# Patient Record
Sex: Female | Born: 1975 | Race: White | Hispanic: No | Marital: Married | Smoking: Former smoker
Health system: Southern US, Community
[De-identification: ages and names within clinical notes are randomized; demographics above are authoritative.]

## PROBLEM LIST (undated history)

## (undated) DIAGNOSIS — B019 Varicella without complication: Secondary | ICD-10-CM

## (undated) DIAGNOSIS — M419 Scoliosis, unspecified: Secondary | ICD-10-CM

## (undated) DIAGNOSIS — F32A Depression, unspecified: Secondary | ICD-10-CM

## (undated) DIAGNOSIS — F329 Major depressive disorder, single episode, unspecified: Secondary | ICD-10-CM

## (undated) DIAGNOSIS — Z8489 Family history of other specified conditions: Secondary | ICD-10-CM

## (undated) DIAGNOSIS — Z9889 Other specified postprocedural states: Secondary | ICD-10-CM

## (undated) DIAGNOSIS — R112 Nausea with vomiting, unspecified: Secondary | ICD-10-CM

## (undated) HISTORY — PX: TUBAL LIGATION: SHX77

## (undated) HISTORY — PX: OTHER SURGICAL HISTORY: SHX169

## (undated) HISTORY — PX: AUGMENTATION MAMMAPLASTY: SUR837

## (undated) HISTORY — PX: DILATION AND CURETTAGE OF UTERUS: SHX78

## (undated) HISTORY — DX: Major depressive disorder, single episode, unspecified: F32.9

## (undated) HISTORY — PX: EYE SURGERY: SHX253

## (undated) HISTORY — DX: Depression, unspecified: F32.A

## (undated) HISTORY — DX: Varicella without complication: B01.9

---

## 2001-05-19 ENCOUNTER — Other Ambulatory Visit: Admission: RE | Admit: 2001-05-19 | Discharge: 2001-05-19 | Payer: Self-pay | Admitting: Family Medicine

## 2002-08-17 HISTORY — PX: BREAST SURGERY: SHX581

## 2007-05-23 ENCOUNTER — Encounter: Payer: Self-pay | Admitting: Maternal & Fetal Medicine

## 2007-06-13 ENCOUNTER — Encounter: Payer: Self-pay | Admitting: Maternal & Fetal Medicine

## 2007-10-27 ENCOUNTER — Observation Stay: Payer: Self-pay

## 2007-11-02 ENCOUNTER — Inpatient Hospital Stay: Payer: Self-pay | Admitting: Unknown Physician Specialty

## 2008-03-27 ENCOUNTER — Emergency Department: Payer: Self-pay | Admitting: Emergency Medicine

## 2008-03-28 ENCOUNTER — Emergency Department: Payer: Self-pay | Admitting: Emergency Medicine

## 2009-09-03 ENCOUNTER — Ambulatory Visit: Payer: Self-pay

## 2010-08-21 ENCOUNTER — Emergency Department: Payer: Self-pay | Admitting: Emergency Medicine

## 2011-07-17 ENCOUNTER — Emergency Department: Payer: Self-pay | Admitting: Emergency Medicine

## 2011-07-19 ENCOUNTER — Ambulatory Visit: Payer: Self-pay | Admitting: Emergency Medicine

## 2011-07-23 ENCOUNTER — Ambulatory Visit: Payer: Self-pay | Admitting: Obstetrics and Gynecology

## 2011-07-24 ENCOUNTER — Ambulatory Visit: Payer: Self-pay | Admitting: Obstetrics and Gynecology

## 2011-07-28 LAB — PATHOLOGY REPORT

## 2011-09-11 ENCOUNTER — Emergency Department: Payer: Self-pay | Admitting: Internal Medicine

## 2011-11-11 ENCOUNTER — Ambulatory Visit: Payer: Self-pay | Admitting: Obstetrics and Gynecology

## 2011-11-11 DIAGNOSIS — R079 Chest pain, unspecified: Secondary | ICD-10-CM

## 2011-11-11 LAB — COMPREHENSIVE METABOLIC PANEL
Albumin: 4 g/dL (ref 3.4–5.0)
BUN: 9 mg/dL (ref 7–18)
Bilirubin,Total: 0.4 mg/dL (ref 0.2–1.0)
Calcium, Total: 9 mg/dL (ref 8.5–10.1)
Chloride: 107 mmol/L (ref 98–107)
Co2: 29 mmol/L (ref 21–32)
EGFR (African American): >60
EGFR (Non-African Amer.): >60
Osmolality: 283 (ref 275–301)
Potassium: 3.9 mmol/L (ref 3.5–5.1)
SGOT(AST): 22 U/L (ref 15–37)
SGPT (ALT): 20 U/L

## 2011-11-11 LAB — HCG, QUANTITATIVE, PREGNANCY: Beta Hcg, Quant.: <1 m[IU]/mL — ABNORMAL LOW

## 2011-11-11 LAB — CBC
HCT: 40.7 % (ref 35.0–47.0)
MCH: 32 pg (ref 26.0–34.0)
MCV: 94 fL (ref 80–100)
Platelet: 310 10*3/uL (ref 150–440)
RBC: 4.31 10*6/uL (ref 3.80–5.20)
RDW: 13 % (ref 11.5–14.5)

## 2011-11-12 ENCOUNTER — Ambulatory Visit: Payer: Self-pay | Admitting: Obstetrics and Gynecology

## 2011-11-16 LAB — PATHOLOGY REPORT

## 2011-12-15 ENCOUNTER — Ambulatory Visit: Payer: Self-pay | Admitting: Gynecologic Oncology

## 2012-07-25 ENCOUNTER — Emergency Department: Payer: Self-pay | Admitting: Emergency Medicine

## 2012-07-25 LAB — URINALYSIS, COMPLETE
Bacteria: NONE SEEN
Bilirubin,UR: NEGATIVE
Glucose,UR: NEGATIVE mg/dL (ref 0–75)
Leukocyte Esterase: NEGATIVE
Protein: 30
RBC,UR: 2 /HPF (ref 0–5)
Specific Gravity: 1.03 (ref 1.003–1.030)
Squamous Epithelial: 2
WBC UR: 4 /HPF (ref 0–5)

## 2012-09-15 ENCOUNTER — Emergency Department: Payer: Self-pay | Admitting: Emergency Medicine

## 2012-09-15 LAB — URINALYSIS, COMPLETE
Glucose,UR: NEGATIVE mg/dL (ref 0–75)
Ketone: NEGATIVE
Leukocyte Esterase: NEGATIVE
Ph: 8 (ref 4.5–8.0)
Protein: NEGATIVE
Specific Gravity: 1.01 (ref 1.003–1.030)
Squamous Epithelial: <1
WBC UR: <1 /HPF (ref 0–5)

## 2012-09-15 LAB — CBC
HCT: 39.2 % (ref 35.0–47.0)
HGB: 13.5 g/dL (ref 12.0–16.0)
MCH: 32 pg (ref 26.0–34.0)
MCHC: 34.4 g/dL (ref 32.0–36.0)
MCV: 93 fL (ref 80–100)
Platelet: 348 10*3/uL (ref 150–440)
RBC: 4.22 10*6/uL (ref 3.80–5.20)

## 2013-05-08 ENCOUNTER — Inpatient Hospital Stay: Payer: Self-pay

## 2013-05-08 LAB — CBC WITH DIFFERENTIAL/PLATELET
Basophil %: 0.6 %
Eosinophil #: 0.1 10*3/uL (ref 0.0–0.7)
HCT: 39.5 % (ref 35.0–47.0)
HGB: 13.4 g/dL (ref 12.0–16.0)
Lymphocyte #: 2.2 10*3/uL (ref 1.0–3.6)
Lymphocyte %: 19.4 %
MCH: 31.3 pg (ref 26.0–34.0)
MCHC: 33.8 g/dL (ref 32.0–36.0)
MCV: 92 fL (ref 80–100)
Monocyte %: 8 %
Neutrophil #: 8.2 10*3/uL — ABNORMAL HIGH (ref 1.4–6.5)
Platelet: 289 10*3/uL (ref 150–440)
RBC: 4.28 10*6/uL (ref 3.80–5.20)
RDW: 15.4 % — ABNORMAL HIGH (ref 11.5–14.5)

## 2013-05-09 LAB — HEMATOCRIT: HCT: 33.3 % — ABNORMAL LOW (ref 35.0–47.0)

## 2013-05-10 LAB — HEMOGLOBIN: HGB: 10.8 g/dL — ABNORMAL LOW (ref 12.0–16.0)

## 2013-05-10 LAB — PATHOLOGY REPORT

## 2013-11-22 ENCOUNTER — Emergency Department: Payer: Self-pay | Admitting: Emergency Medicine

## 2014-12-07 NOTE — Op Note (Signed)
PATIENT NAME:  Lynn Nelson, CANTIN MR#:  [id number] DATE OF BIRTH:  1976-05-22  DATE OF PROCEDURE:  05/09/2013  PREOPERATIVE DIAGNOSIS: Postpartum day 1, desires permanent sterilization, declines less permanent means.  POSTOPERATIVE DIAGNOSIS: Postpartum day 1, desires permanent sterilization, declines less permanent means.  PROCEDURE: Postpartum bilateral tubal ligation.   SURGEON: Ricky L. Amalia Hailey, M.D.   ANESTHESIA: General endotracheal by Dr. Ronelle Nigh.   FINDINGS: Grossly normal postpartum abdomen, uterine fundus and fallopian tubes.   ESTIMATED BLOOD LOSS: Minimal, less than 5 mL.   COMPLICATIONS: None.   SPECIMENS: Portions of bilateral fallopian tubes.   DRAINS: None.   PROCEDURE IN DETAIL: The patient was consented. I did review with her preoperative less permanent means, option of interval tubal, the risks of bleeding 200 to 300, the risk of failure with 60% chance of subsequent ectopic. They understand and desire to proceed. The consent was signed. She was taken to the operating room and placed in the supine position where anesthesia was initiated, prepped and draped in the usual sterile fashion. The infraumbilical region was injected with 10% of 0.5 Sensorcaine and a semi-lunar transverse incision measuring approximately 3 cm was created with a #15 blade. It was carried sharply down into the peritoneal cavity. With the aid of Omni retractors, I visualized the right fallopian tube. It was grasped, traced to the fimbriated end to ensure identification and regrasped in a relatively avascular portion in the mid ampullary region. A knuckle was created with Kary Kos and double ligated with 0 plain. The knuckle was excised sharply with Metzenbaum. The stumps were cauterized with a Bovie. The area seemed to be hemostatic and allowed to return to the abdominal cavity. We then turned our attention to the left fallopian tube and proceeded in similar fashion. Specimens were sent and labeled as right  fallopian tube and left fallopian tube. Areas were re-inspected visually for hemostasis and seen to be excellent. Fascia was grasped and then closed with a 3 vertical mattress sutures of 0 Vicryl. The subcutaneous was reapproximated with 3-0 Vicryl and a Band-Aid was placed.   The patient tolerated the procedure well. All instrument, needle, and sponge counts were correct. I anticipate a routine postoperative course.   ____________________________ Rockey Situ. Amalia Hailey, MD rle:aw D: 05/09/2013 10:43:55 ET T: 05/09/2013 10:58:46 ET JOB#: [id number]  cc: Audry Pili L. Amalia Hailey, MD, <Dictator> Selmer Dominion MD ELECTRONICALLY SIGNED 05/10/2013 12:40

## 2014-12-09 NOTE — Op Note (Signed)
PATIENT NAME:  Lynn Nelson MR#:  [id number] DATE OF BIRTH:  Apr 06, 1976  DATE OF PROCEDURE:  11/12/2011  PREOPERATIVE DIAGNOSIS: Suspected lesion in the right cornu of the uterus.   POSTOPERATIVE DIAGNOSIS: Suspected lesion in the right cornu of the uterus.   PROCEDURES:  1. Diagnostic laparoscopy. 2. Hysteroscopy. 3. Biopsy of right cornual lesion, on the endometrium, by hysteroscopy.  SURGEON: Prentice Docker, MD  ASSISTANT: Malachy Mood, MD  ANESTHESIA: General.  ESTIMATED BLOOD LOSS: Minimal.  OPERATIVE FLUIDS: 400 mL crystalloid.   COMPLICATIONS: None.   FINDINGS:  1. Two areas of likely endometriosis at site of uterine perforation from prior surgery and along the bladder.  2. No obvious lesion in the right or left cornu by laparoscopy.  3. Small yellow-appearing lesion in the right cornu by hysteroscopy.  SPECIMEN: Biopsy of endometrial lesion from the right cornua.   CONDITION: Stable.  INDICATIONS FOR PROCEDURE: Lynn Nelson is a 39 year old female who had a lesion incidentally noted in her right cornual area, suspected to be in the myometrium but impinging on the endometrium, by ultrasound. This was noted incidentally and has been followed. A MRI was also ordered to better characterize the lesion. The MRI was inconclusive in terms of additional information. Given the patient's considerable worry over the lesion, she was taken to the operating room for diagnostic laparoscopy, diagnostic hysteroscopy, and a biopsy or removal of any abnormal tissue.   PROCEDURE IN DETAIL: The patient was met in the preoperative area and the procedure was reviewed and the patient's questions were answered. She was taken to the operating room and placed under general anesthesia, which was found to be adequate. She was placed in the dorsal supine lithotomy position and prepped and draped in the usual sterile fashion. A sterile speculum was placed in the vagina and a single-tooth  tenaculum was used to grasp the anterior lip of the cervix. The uterus was sounded to approximately 9 cm and a uterine manipulator was affixed to the single-tooth tenaculum. Initially the bladder was straight catheterized and drained of about 300 mL of clear urine.   Attention was turned to the abdomen where after injection of local anesthetic a 5 mm infraumbilical incision was made and the abdomen was entered by using an Optiview trocar under direct visualization. The abdomen was connected to gas and the abdomen opening pressure was found to be consistent with entry into the abdomen. After the abdomen was insufflated with CO2, the camera was introduced through the port and atraumatic entry was verified. A right lower quadrant 5 mm port was placed after injection of local anesthetic under direct intraabdominal visualization and in a similar fashion a left lower quadrant. 5 mm port was placed. The pelvis was inspected with the above noted findings.   Attention was turned to the hysteroscopic portion of the procedure where a speculum was placed and the uterine manipulator was removed. The cervix was dilated gently in a serial fashion using Hegar dilators to a dilatation of 8 mm. The operative hysteroscope was then gently introduced through the cervix with the above noted findings. The resectoscope was used to take a small sample from the right cornual area that appeared to be inconsistent with usual appearing endometrial tissue. There did not seem to be significant distortion of the anatomy of the right cornu from any obvious lesions. This appeared to be similar to the left cornual region. After the biopsy was taken and removed, the hysteroscope was then removed from the uterus and the  acorn manipulator was replaced.   Attention was turned back to the abdomen to ensure no damage was done hysteroscopically. There was no change in the intraabdominal examination and the patient was hemostatic. At this point, it was  felt that the procedure should be terminated as no significant lesion could be found for biopsy removal apart from what had been biopsied hysteroscopically. The instruments were all removed from the abdominal ports. The abdomen was then desufflated of all CO2. The ports were then removed. The skin was reapproximated in the subcutaneous layer using #3-0 Monocryl. The skin was closed with Dermabond at all three incision sites. The acorn manipulator was removed from the cervix followed by removal of the single-tooth tenaculum with hemostasis at the tenaculum entry sites on the cervix. The speculum was then removed. The vagina was verified to be free of any sponges or instruments at the end of the procedure. Of note, the patient was straight catheterized at the beginning of the procedure. However, due to insufficient emptying of her bladder, a Foley catheter had to be placed during the procedure, which was also removed at the end of the procedure by me.   The patient tolerated the procedure well. Sponge, lap, and needle counts were correct x2. For VTE prophylaxis, the patient had pneumatic compression stockings on throughout the entire procedure.  ____________________________ Will Bonnet, MD sdj:slb D: 11/12/2011 12:29:24 ET T: 11/12/2011 13:04:34 ET JOB#: [id number]  cc: Will Bonnet, MD, <Dictator> Will Bonnet MD ELECTRONICALLY SIGNED 11/12/2011 23:42

## 2014-12-25 NOTE — H&P (Signed)
L&D Evaluation:  History:  HPI 39 y/o [id number] edc 9/19   Presents with leaking fluid, 4am   Patient's Medical History No Chronic Illness   Patient's Surgical History none   Medications Pre Natal Vitamins   Allergies NKDA   Social History none   Family History Non-Contributory   ROS:  ROS All systems were reviewed.  HEENT, CNS, GI, GU, Respiratory, CV, Renal and Musculoskeletal systems were found to be normal.   Exam:  General no apparent distress   Abdomen gravid, tender with contractions   Estimated Fetal Weight Average for gestational age   Pelvic 4/90   Mebranes Ruptured   Description clear   FHT normal rate with no decels   Ucx regular   Impression:  Impression early labor   Plan:  Plan monitor contractions and for cervical change   Comments epidural prn   Electronic Signatures: Edison Nasuti (MD)  (Signed 22-Sep-14 06:35)  Authored: L&D Evaluation   Last Updated: 22-Sep-14 06:35 by Edison Nasuti (MD)

## 2015-01-16 ENCOUNTER — Emergency Department
Admission: EM | Admit: 2015-01-16 | Discharge: 2015-01-16 | Disposition: A | Discharge status: Home / Self Care | Payer: PRIVATE HEALTH INSURANCE | Attending: Emergency Medicine | Admitting: Emergency Medicine

## 2015-01-16 DIAGNOSIS — Y998 Other external cause status: Secondary | ICD-10-CM | POA: Diagnosis not present

## 2015-01-16 DIAGNOSIS — Y9289 Other specified places as the place of occurrence of the external cause: Secondary | ICD-10-CM | POA: Diagnosis not present

## 2015-01-16 DIAGNOSIS — S70362A Insect bite (nonvenomous), left thigh, initial encounter: Secondary | ICD-10-CM | POA: Diagnosis present

## 2015-01-16 DIAGNOSIS — Y9389 Activity, other specified: Secondary | ICD-10-CM | POA: Insufficient documentation

## 2015-01-16 DIAGNOSIS — Z880 Allergy status to penicillin: Secondary | ICD-10-CM | POA: Insufficient documentation

## 2015-01-16 DIAGNOSIS — W57XXXA Bitten or stung by nonvenomous insect and other nonvenomous arthropods, initial encounter: Secondary | ICD-10-CM | POA: Insufficient documentation

## 2015-01-16 DIAGNOSIS — L03116 Cellulitis of left lower limb: Secondary | ICD-10-CM | POA: Diagnosis not present

## 2015-01-16 MED ORDER — DOXYCYCLINE HYCLATE 100 MG PO CAPS: 100.0000 mg | ORAL_CAPSULE | Freq: Two times a day (BID) | ORAL | Status: DC

## 2015-01-16 NOTE — ED Provider Notes (Signed)
[facility] Emergency Department Provider Note ____________________________________________  Time seen: 1205  I have reviewed the triage vital signs and the nursing notes.   HISTORY  Chief Complaint Insect Bite   HPI Lynn Nelson is a 39 y.o. female patient is here today with complaint of left inner thigh redness after being bit by a tick. She states this happened Sunday and the swelling began the next day. She denies any known fever. She states she has had a headache since yesterday and feels extremely tired. She has not taken any medication for this and currently her pain is 5 out of 10.   History reviewed. No pertinent past medical history.  There are no active problems to display for this patient.   Past Surgical History  Procedure Laterality Date  . Tubal ligation    . Dilation and curettage of uterus      x2  . Breast agumentation       BL    Current Outpatient Rx  Name  Route  Sig  Dispense  Refill  . doxycycline (VIBRAMYCIN) 100 MG capsule   Oral   Take 1 capsule (100 mg total) by mouth 2 (two) times daily.   14 capsule   0     Allergies Penicillins  No family history on file.  Social History History  Substance Use Topics  . Smoking status: Never Smoker   . Smokeless tobacco: Never Used  . Alcohol Use: 0.6 oz/week    1 Glasses of wine per week    Review of Systems Constitutional: No fever/chills Eyes: No visual changes. ENT: No sore throat. Cardiovascular: Denies chest pain. Respiratory: Denies shortness of breath. Gastrointestinal: No abdominal pain.  No nausea, no vomiting .Genitourinary: Negative for dysuria. Musculoskeletal: Negative for back pain. Skin: Negative for rash. Neurological: Negative for headaches, focal weakness or numbness.  10-point ROS otherwise negative.  ____________________________________________   PHYSICAL EXAM:  VITAL SIGNS: ED Triage Vitals  Enc Vitals Group     BP 01/16/15 1057  109/63 mmHg     Pulse Rate 01/16/15 1057 61     Resp 01/16/15 1057 15     Temp 01/16/15 1057 97.6 F (36.4 C)     Temp Source 01/16/15 1057 Oral     SpO2 01/16/15 1057 100 %     Weight 01/16/15 1057 137 lb (62.143 kg)     Height 01/16/15 1057 5\' 4"  (1.626 m)     Head Cir --      Peak Flow --      Pain Score 01/16/15 1135 5     Pain Loc --      Pain Edu? --      Excl. in [place]? --     Constitutional: Alert and oriented. Well appearing and in no acute distress. Eyes: Conjunctivae are normal. PERRL. EOMI. Head: Atraumatic. Nose: No congestion/rhinnorhea. Neck: No stridor.   Cardiovascular: Normal rate, regular rhythm. Grossly normal heart sounds.  Good peripheral circulation. Respiratory: Normal respiratory effort.  No retractions. Lungs CTAB. Gastrointestinal: Soft and nontender. No distention. No abdominal bruits. No CVA tenderness. Musculoskeletal: No lower extremity tenderness nor edema on the right. Left leg minimally restricted secondary to pain. No joint effusions.  Neurologic:  Normal speech and language. No gross focal neurologic deficits are appreciated. Speech is normal. No gait instability. Skin:  Skin is warm, dry. Left anterior and medial thigh is red and warm. There is one area that is consistent with a tick bite Psychiatric: Mood and  affect are normal. Speech and behavior are normal.  ____________________________________________   LABS (all labs ordered are listed, but only abnormal results are displayed)  Labs Reviewed - No data to display ____________________________________________  PROCEDURES  Procedure(s) performed: None  Critical Care performed: No  ____________________________________________   INITIAL IMPRESSION / ASSESSMENT AND PLAN / ED COURSE  Pertinent labs & imaging results that were available during my care of the patient were reviewed by me and considered in my medical decision making (see chart for details).  Patient was started on  doxycycline. She is return to the emergency room if any severe worsening, fever, unable take the doxycycline. ____________________________________________   FINAL CLINICAL IMPRESSION(S) / ED DIAGNOSES  Final diagnoses:  Cellulitis of left thigh  Tick bite of thigh, left, initial encounter      Johnn Hai, PA-C 01/16/15 1421

## 2015-01-16 NOTE — Discharge Instructions (Signed)
° °  You will need to return to ER if any severe worsening of your symptoms. YOU CAN NOT GO OUT INTO THE SUN WITH THIS MEDICATION

## 2015-01-16 NOTE — ED Notes (Signed)
Pt states she removed a tick from her left upper leg a couple of days ago and is c/o rash near the area

## 2015-01-16 NOTE — ED Notes (Signed)
Pt states that she got a tick bite on her left inner thigh . She states that she pulled it off and is sure that it was a tick. She states that this happened this Sunday. The redness and swelling in the area has been increasing since then . And she states that she has pain in the area. She states that she has been extremely tired and and has had headache since yesterday.

## 2015-10-10 ENCOUNTER — Encounter: Payer: Self-pay | Admitting: Nurse Practitioner

## 2015-10-10 ENCOUNTER — Ambulatory Visit (INDEPENDENT_AMBULATORY_CARE_PROVIDER_SITE_OTHER): Payer: Managed Care, Other (non HMO) | Admitting: Nurse Practitioner

## 2015-10-10 VITALS — BP 90/62 | HR 89 | Temp 98.2°F | Resp 18 | Ht 64.0 in | Wt 124.1 lb

## 2015-10-10 DIAGNOSIS — F4329 Adjustment disorder with other symptoms: Secondary | ICD-10-CM

## 2015-10-10 DIAGNOSIS — Z634 Disappearance and death of family member: Secondary | ICD-10-CM

## 2015-10-10 DIAGNOSIS — Z7189 Other specified counseling: Secondary | ICD-10-CM | POA: Diagnosis not present

## 2015-10-10 DIAGNOSIS — Z7689 Persons encountering health services in other specified circumstances: Secondary | ICD-10-CM

## 2015-10-10 DIAGNOSIS — F4321 Adjustment disorder with depressed mood: Secondary | ICD-10-CM

## 2015-10-10 MED ORDER — BUSPIRONE HCL 5 MG PO TABS: 5.0000 mg | ORAL_TABLET | Freq: Three times a day (TID) | ORAL | Status: DC

## 2015-10-10 NOTE — Patient Instructions (Addendum)
Follow up with me in 3-4 weeks.   Welcome to [facility]! Nice to meet you.

## 2015-10-10 NOTE — Progress Notes (Signed)
Patient ID: Lynn Nelson, female    DOB: April 15, 1976  Age: 40 y.o. MRN: [id number]  CC: Establish Care   HPI Lynn Nelson presents for establishing care and CC of bereavement.   1) Pt reports she lost her husband 2 weeks ago in an accident. They were separated at the time and she has two young sons (2 and 13 yo). The 54 yo has been given resources for counseling by school guidance counselors. She is interested in going through Hospice for bereavement counseling for her and her son. She has the number. She is flat, but breaks into tears when speaking about him.   History Lynn Nelson has a past medical history of Depression and Chicken pox.   She has past surgical history that includes Tubal ligation; Dilation and curettage of uterus; breast agumentation ; Eye surgery; and Breast surgery (2004).   Her family history includes Alcohol abuse in her paternal grandfather and paternal grandmother; Cancer in her mother, paternal grandfather, and paternal grandmother; Diabetes in her maternal grandmother; Heart disease in her father; Stroke in her maternal grandmother.She reports that she has never smoked. She has never used smokeless tobacco. She reports that she drinks about 1.8 oz of alcohol per week. She reports that she does not use illicit drugs.  Outpatient Prescriptions Prior to Visit  Medication Sig Dispense Refill  . doxycycline (VIBRAMYCIN) 100 MG capsule Take 1 capsule (100 mg total) by mouth 2 (two) times daily. (Patient not taking: Reported on 10/10/2015) 14 capsule 0   No facility-administered medications prior to visit.    ROS Review of Systems  Constitutional: Negative for fever, chills, diaphoresis and fatigue.  Respiratory: Negative for chest tightness, shortness of breath and wheezing.   Cardiovascular: Negative for chest pain, palpitations and leg swelling.  Gastrointestinal: Negative for nausea, vomiting and diarrhea.  Skin: Negative for rash.  Neurological: Negative for  dizziness and headaches.  Psychiatric/Behavioral: Positive for sleep disturbance. Negative for suicidal ideas. The patient is nervous/anxious.     Objective:  BP 90/62 mmHg  Pulse 89  Temp(Src) 98.2 F (36.8 C) (Oral)  Resp 18  Ht 5\' 4"  (1.626 m)  Wt 124 lb 2 oz (56.303 kg)  BMI 21.30 kg/m2  SpO2 98%  LMP 09/19/2015 (Exact Date)  Physical Exam  Constitutional: She is oriented to person, place, and time. She appears well-developed and well-nourished. No distress.  HENT:  Head: Normocephalic and atraumatic.  Right Ear: External ear normal.  Left Ear: External ear normal.  Cardiovascular: Normal rate, regular rhythm and normal heart sounds.  Exam reveals no gallop and no friction rub.   No murmur heard. Pulmonary/Chest: Effort normal and breath sounds normal. No respiratory distress. She has no wheezes. She has no rales. She exhibits no tenderness.  Neurological: She is alert and oriented to person, place, and time. No cranial nerve deficit. She exhibits normal muscle tone. Coordination normal.  Skin: Skin is warm and dry. No rash noted. She is not diaphoretic.  Psychiatric: Her behavior is normal. Judgment and thought content normal.  Patient is tearful in talking about husband, but seems flat otherwise      Assessment & Plan:   Lynn Nelson was seen today for establish care.  Diagnoses and all orders for this visit:  Encounter to establish care  '  Other orders -     busPIRone (BUSPAR) 5 MG tablet; Take 1 tablet (5 mg total) by mouth 3 (three) times daily.   I have discontinued Ms. Rakes's doxycycline. I am  also having her start on busPIRone.  Meds ordered this encounter  Medications  . busPIRone (BUSPAR) 5 MG tablet    Sig: Take 1 tablet (5 mg total) by mouth 3 (three) times daily.    Dispense:  90 tablet    Refill:  0    Order Specific Question:  Supervising Provider    Answer:  Crecencio Mc [[id number]]     Follow-up: Return in about 4 weeks (around 11/07/2015)  for FU Medication.

## 2015-10-10 NOTE — Progress Notes (Signed)
Pre visit review using our clinic review tool, if applicable. No additional management support is needed unless otherwise documented below in the visit note. 

## 2015-10-17 DIAGNOSIS — Z7689 Persons encountering health services in other specified circumstances: Secondary | ICD-10-CM | POA: Insufficient documentation

## 2015-10-17 DIAGNOSIS — F4329 Adjustment disorder with other symptoms: Secondary | ICD-10-CM | POA: Insufficient documentation

## 2015-10-17 DIAGNOSIS — Z634 Disappearance and death of family member: Secondary | ICD-10-CM

## 2015-10-17 DIAGNOSIS — F4321 Adjustment disorder with depressed mood: Secondary | ICD-10-CM | POA: Insufficient documentation

## 2015-10-17 NOTE — Assessment & Plan Note (Signed)
Discussed acute and chronic issues. Reviewed health maintenance measures, PFSHx, and immunizations. Obtain records from previous places

## 2015-10-17 NOTE — Assessment & Plan Note (Signed)
Patient is currently having complicated grieving this is new onset due to recent passing of husband She has 2 small children is supported by her mother currently Patient is willing to try medications but is also interested in hospice counseling, she has been number and will contact them for further information We will follow-up closely in 4 weeks or sooner as needed We will try BuSpar 5 mg up to 3 times daily as needed discussed that if it's, risks, side effects and asked her to call if anything she is concerned about

## 2015-11-05 ENCOUNTER — Other Ambulatory Visit: Payer: Self-pay | Admitting: Nurse Practitioner

## 2015-11-07 ENCOUNTER — Ambulatory Visit (INDEPENDENT_AMBULATORY_CARE_PROVIDER_SITE_OTHER): Payer: Managed Care, Other (non HMO) | Admitting: Nurse Practitioner

## 2015-11-07 ENCOUNTER — Encounter: Payer: Self-pay | Admitting: Nurse Practitioner

## 2015-11-07 VITALS — BP 104/68 | HR 60 | Temp 98.1°F | Resp 14 | Ht 64.0 in | Wt 124.8 lb

## 2015-11-07 DIAGNOSIS — F4329 Adjustment disorder with other symptoms: Secondary | ICD-10-CM

## 2015-11-07 DIAGNOSIS — F4321 Adjustment disorder with depressed mood: Secondary | ICD-10-CM

## 2015-11-07 DIAGNOSIS — Z634 Disappearance and death of family member: Principal | ICD-10-CM

## 2015-11-07 MED ORDER — BUSPIRONE HCL 10 MG PO TABS: 10.0000 mg | ORAL_TABLET | Freq: Three times a day (TID) | ORAL | Status: DC

## 2015-11-07 NOTE — Progress Notes (Signed)
Patient ID: Lynn Nelson, female    DOB: Apr 05, 1976  Age: 40 y.o. MRN: [id number]  CC: Depression   HPI Lynn Nelson presents for follow up bereavement and meds.   1) Started back to work and it has been a distraction  Getting sons into counseling and then herself at a later date Taking BuSpar three times daily and somewhat helpful   Wearing off after 2 hrs  Sleeping has improved somewhat  Finding ways to celebrate her late husband's life in nature, tatoos, and artifacts left behind.   History Lynn Nelson has a past medical history of Depression and Chicken pox.   She has past surgical history that includes Tubal ligation; Dilation and curettage of uterus; breast agumentation ; Eye surgery; and Breast surgery (2004).   Her family history includes Alcohol abuse in her paternal grandfather and paternal grandmother; Cancer in her mother, paternal grandfather, and paternal grandmother; Diabetes in her maternal grandmother; Heart disease in her father; Stroke in her maternal grandmother.She reports that she has never smoked. She has never used smokeless tobacco. She reports that she drinks about 1.8 oz of alcohol per week. She reports that she does not use illicit drugs.  Outpatient Prescriptions Prior to Visit  Medication Sig Dispense Refill  . busPIRone (BUSPAR) 5 MG tablet Take 1 tablet (5 mg total) by mouth 3 (three) times daily. 90 tablet 0   No facility-administered medications prior to visit.    ROS Review of Systems  Constitutional: Negative for fever, chills, diaphoresis and fatigue.  Respiratory: Negative for chest tightness, shortness of breath and wheezing.   Cardiovascular: Negative for chest pain, palpitations and leg swelling.  Gastrointestinal: Negative for nausea, vomiting and diarrhea.  Skin: Negative for rash.  Neurological: Negative for dizziness and headaches.  Psychiatric/Behavioral: Negative for suicidal ideas, sleep disturbance and self-injury. The patient  is nervous/anxious.     Objective:  BP 104/68 mmHg  Pulse 60  Temp(Src) 98.1 F (36.7 C) (Oral)  Resp 14  Ht 5\' 4"  (1.626 m)  Wt 124 lb 12.8 oz (56.609 kg)  BMI 21.41 kg/m2  SpO2 99%  LMP 09/19/2015 (Exact Date)  Physical Exam  Constitutional: She is oriented to person, place, and time. She appears well-developed and well-nourished. No distress.  HENT:  Head: Normocephalic and atraumatic.  Right Ear: External ear normal.  Left Ear: External ear normal.  Cardiovascular: Normal rate and regular rhythm.   Pulmonary/Chest: Effort normal and breath sounds normal. No respiratory distress. She has no wheezes. She has no rales. She exhibits no tenderness.  Neurological: She is alert and oriented to person, place, and time.  Skin: Skin is warm and dry. No rash noted. She is not diaphoretic.  Psychiatric: She has a normal mood and affect. Her behavior is normal. Judgment and thought content normal.  Good eye contact, not tearful   Assessment & Plan:   [name] was seen today for depression.  Diagnoses and all orders for this visit:  Complicated grieving  Other orders -     busPIRone (BUSPAR) 10 MG tablet; Take 1 tablet (10 mg total) by mouth 3 (three) times daily.  I have discontinued Ms. Rakes's busPIRone. I am also having her start on busPIRone.  Meds ordered this encounter  Medications  . busPIRone (BUSPAR) 10 MG tablet    Sig: Take 1 tablet (10 mg total) by mouth 3 (three) times daily.    Dispense:  90 tablet    Refill:  0    Order Specific Question:  Supervising Provider    Answer:  Crecencio Mc [[id number]]     Follow-up: Return in about 4 weeks (around 12/05/2015) for Follow up.

## 2015-11-07 NOTE — Patient Instructions (Signed)
Great to see you!  I will see you in 4 weeks to check in.

## 2015-11-08 NOTE — Assessment & Plan Note (Signed)
Seems stable today Pt seems to have a good grip on what has happened, dealing with family very well and helping sons deal with grief first.  Will up buspirone to 10 mg slowly titrate back up to 3 x daily and see if this is better.  FU in 4 weeks or sooner

## 2015-11-14 ENCOUNTER — Telehealth: Payer: Self-pay

## 2015-11-14 ENCOUNTER — Encounter: Payer: Self-pay | Admitting: Nurse Practitioner

## 2015-11-14 ENCOUNTER — Ambulatory Visit (INDEPENDENT_AMBULATORY_CARE_PROVIDER_SITE_OTHER): Payer: Managed Care, Other (non HMO) | Admitting: Nurse Practitioner

## 2015-11-14 VITALS — BP 100/60 | HR 60 | Temp 98.0°F | Ht 64.0 in | Wt 124.3 lb

## 2015-11-14 DIAGNOSIS — Z634 Disappearance and death of family member: Secondary | ICD-10-CM

## 2015-11-14 DIAGNOSIS — Z1239 Encounter for other screening for malignant neoplasm of breast: Secondary | ICD-10-CM | POA: Diagnosis not present

## 2015-11-14 DIAGNOSIS — F4329 Adjustment disorder with other symptoms: Secondary | ICD-10-CM

## 2015-11-14 DIAGNOSIS — Z0000 Encounter for general adult medical examination without abnormal findings: Secondary | ICD-10-CM | POA: Diagnosis not present

## 2015-11-14 DIAGNOSIS — F4321 Adjustment disorder with depressed mood: Secondary | ICD-10-CM | POA: Diagnosis not present

## 2015-11-14 LAB — CBC WITH DIFFERENTIAL/PLATELET
BASOS PCT: 0.4 % (ref 0.0–3.0)
Basophils Absolute: 0 10*3/uL (ref 0.0–0.1)
EOS ABS: 0.1 10*3/uL (ref 0.0–0.7)
Eosinophils Relative: 1 % (ref 0.0–5.0)
HEMATOCRIT: 40.8 % (ref 36.0–46.0)
Hemoglobin: 13.9 g/dL (ref 12.0–15.0)
LYMPHS ABS: 1.5 10*3/uL (ref 0.7–4.0)
Lymphocytes Relative: 22.5 % (ref 12.0–46.0)
MCHC: 34.2 g/dL (ref 30.0–36.0)
MCV: 92.1 fl (ref 78.0–100.0)
MONO ABS: 0.5 10*3/uL (ref 0.1–1.0)
Monocytes Relative: 8.2 % (ref 3.0–12.0)
NEUTROS ABS: 4.5 10*3/uL (ref 1.4–7.7)
Neutrophils Relative %: 67.9 % (ref 43.0–77.0)
PLATELETS: 407 10*3/uL — AB (ref 150.0–400.0)
RBC: 4.43 Mil/uL (ref 3.87–5.11)
RDW: 13.4 % (ref 11.5–15.5)
WBC: 6.7 10*3/uL (ref 4.0–10.5)

## 2015-11-14 LAB — LIPID PANEL
CHOL/HDL RATIO: 3
Cholesterol: 183 mg/dL (ref 0–200)
HDL: 72.2 mg/dL (ref >39.00)
LDL CALC: 93 mg/dL (ref 0–99)
NonHDL: 110.58
TRIGLYCERIDES: 86 mg/dL (ref 0.0–149.0)
VLDL: 17.2 mg/dL (ref 0.0–40.0)

## 2015-11-14 LAB — TSH: TSH: 1.51 u[IU]/mL (ref 0.35–4.50)

## 2015-11-14 LAB — HEMOGLOBIN A1C: HEMOGLOBIN A1C: 5.2 % (ref 4.6–6.5)

## 2015-11-14 LAB — COMPREHENSIVE METABOLIC PANEL
ALT: 16 U/L (ref 0–35)
AST: 15 U/L (ref 0–37)
Albumin: 4.5 g/dL (ref 3.5–5.2)
Alkaline Phosphatase: 52 U/L (ref 39–117)
BUN: 12 mg/dL (ref 6–23)
CALCIUM: 9.7 mg/dL (ref 8.4–10.5)
CHLORIDE: 102 meq/L (ref 96–112)
CO2: 28 meq/L (ref 19–32)
Creatinine, Ser: 0.74 mg/dL (ref 0.40–1.20)
GFR: 93.39 mL/min (ref >60.00)
Glucose, Bld: 76 mg/dL (ref 70–99)
Potassium: 4.9 mEq/L (ref 3.5–5.1)
Sodium: 138 mEq/L (ref 135–145)
Total Bilirubin: 0.6 mg/dL (ref 0.2–1.2)
Total Protein: 7 g/dL (ref 6.0–8.3)

## 2015-11-14 NOTE — Telephone Encounter (Signed)
Pt had an appt today for CPE. She brought with her an employer routine Preventive Care form.  Form filled out and stamped with office stamp.

## 2015-11-14 NOTE — Patient Instructions (Signed)
[facility] at [facility]  Address: [address] [place], [place] [address]  Phone: [phone] Hours:  Monday - Thursday: 8 a.m. - 5 p.m. Friday: 8 a.m. - 3 p.m.     Health Maintenance, Female Adopting a healthy lifestyle and getting preventive care can go a long way to promote health and wellness. Talk with your health care provider about what schedule of regular examinations is right for you. This is a good chance for you to check in with your provider about disease prevention and staying healthy. In between checkups, there are plenty of things you can do on your own. Experts have done a lot of research about which lifestyle changes and preventive measures are most likely to keep you healthy. Ask your health care provider for more information. WEIGHT AND DIET  Eat a healthy diet  Be sure to include plenty of vegetables, fruits, low-fat dairy products, and lean protein.  Do not eat a lot of foods high in solid fats, added sugars, or salt.  Get regular exercise. This is one of the most important things you can do for your health.  Most adults should exercise for at least 150 minutes each week. The exercise should increase your heart rate and make you sweat (moderate-intensity exercise).  Most adults should also do strengthening exercises at least twice a week. This is in addition to the moderate-intensity exercise.  Maintain a healthy weight  Body mass index (BMI) is a measurement that can be used to identify possible weight problems. It estimates body fat based on height and weight. Your health care provider can help determine your BMI and help you achieve or maintain a healthy weight.  For females 80 years of age and older:   A BMI below 18.5 is considered underweight.  A BMI of 18.5 to 24.9 is normal.  A BMI of 25 to 29.9 is considered overweight.  A BMI of 30 and above is considered obese.  Watch levels of cholesterol and blood lipids  You  should start having your blood tested for lipids and cholesterol at 38 years of age, then have this test every 5 years.  You may need to have your cholesterol levels checked more often if:  Your lipid or cholesterol levels are high.  You are older than 38 years of age.  You are at high risk for heart disease.  CANCER SCREENING   Lung Cancer  Lung cancer screening is recommended for adults 35-6 years old who are at high risk for lung cancer because of a history of smoking.  A yearly low-dose CT scan of the lungs is recommended for people who:  Currently smoke.  Have quit within the past 15 years.  Have at least a 30-pack-year history of smoking. A pack year is smoking an average of one pack of cigarettes a day for 1 year.  Yearly screening should continue until it has been 15 years since you quit.  Yearly screening should stop if you develop a health problem that would prevent you from having lung cancer treatment.  Breast Cancer  Practice breast self-awareness. This means understanding how your breasts normally appear and feel.  It also means doing regular breast self-exams. Let your health care provider know about any changes, no matter how small.  If you are in your 20s or 30s, you should have a clinical breast exam (CBE) by a health care provider every 1-3 years as part of a regular health exam.  If you are  71 or older, have a CBE every year. Also consider having a breast X-ray (mammogram) every year.  If you have a family history of breast cancer, talk to your health care provider about genetic screening.  If you are at high risk for breast cancer, talk to your health care provider about having an MRI and a mammogram every year.  Breast cancer gene (BRCA) assessment is recommended for women who have family members with BRCA-related cancers. BRCA-related cancers include:  Breast.  Ovarian.  Tubal.  Peritoneal cancers.  Results of the assessment will determine  the need for genetic counseling and BRCA1 and BRCA2 testing. Cervical Cancer Your health care provider may recommend that you be screened regularly for cancer of the pelvic organs (ovaries, uterus, and vagina). This screening involves a pelvic examination, including checking for microscopic changes to the surface of your cervix (Pap test). You may be encouraged to have this screening done every 3 years, beginning at age 42.  For women ages 28-65, health care providers may recommend pelvic exams and Pap testing every 3 years, or they may recommend the Pap and pelvic exam, combined with testing for human papilloma virus (HPV), every 5 years. Some types of HPV increase your risk of cervical cancer. Testing for HPV may also be done on women of any age with unclear Pap test results.  Other health care providers may not recommend any screening for nonpregnant women who are considered low risk for pelvic cancer and who do not have symptoms. Ask your health care provider if a screening pelvic exam is right for you.  If you have had past treatment for cervical cancer or a condition that could lead to cancer, you need Pap tests and screening for cancer for at least 20 years after your treatment. If Pap tests have been discontinued, your risk factors (such as having a new sexual partner) need to be reassessed to determine if screening should resume. Some women have medical problems that increase the chance of getting cervical cancer. In these cases, your health care provider may recommend more frequent screening and Pap tests. Colorectal Cancer  This type of cancer can be detected and often prevented.  Routine colorectal cancer screening usually begins at 38 years of age and continues through 38 years of age.  Your health care provider may recommend screening at an earlier age if you have risk factors for colon cancer.  Your health care provider may also recommend using home test kits to check for hidden blood  in the stool.  A small camera at the end of a tube can be used to examine your colon directly (sigmoidoscopy or colonoscopy). This is done to check for the earliest forms of colorectal cancer.  Routine screening usually begins at age 61.  Direct examination of the colon should be repeated every 5-10 years through 38 years of age. However, you may need to be screened more often if early forms of precancerous polyps or small growths are found. Skin Cancer  Check your skin from head to toe regularly.  Tell your health care provider about any new moles or changes in moles, especially if there is a change in a mole's shape or color.  Also tell your health care provider if you have a mole that is larger than the size of a pencil eraser.  Always use sunscreen. Apply sunscreen liberally and repeatedly throughout the day.  Protect yourself by wearing long sleeves, pants, a wide-brimmed hat, and sunglasses whenever you are outside. HEART  DISEASE, DIABETES, AND HIGH BLOOD PRESSURE   High blood pressure causes heart disease and increases the risk of stroke. High blood pressure is more likely to develop in:  People who have blood pressure in the high end of the normal range (130-139/85-89 mm Hg).  People who are overweight or obese.  People who are African American.  If you are 69-71 years of age, have your blood pressure checked every 3-5 years. If you are 47 years of age or older, have your blood pressure checked every year. You should have your blood pressure measured twice--once when you are at a hospital or clinic, and once when you are not at a hospital or clinic. Record the average of the two measurements. To check your blood pressure when you are not at a hospital or clinic, you can use:  An automated blood pressure machine at a pharmacy.  A home blood pressure monitor.  If you are between 25 years and 92 years old, ask your health care provider if you should take aspirin to prevent  strokes.  Have regular diabetes screenings. This involves taking a blood sample to check your fasting blood sugar level.  If you are at a normal weight and have a low risk for diabetes, have this test once every three years after 38 years of age.  If you are overweight and have a high risk for diabetes, consider being tested at a younger age or more often. PREVENTING INFECTION  Hepatitis B  If you have a higher risk for hepatitis B, you should be screened for this virus. You are considered at high risk for hepatitis B if:  You were born in a country where hepatitis B is common. Ask your health care provider which countries are considered high risk.  Your parents were born in a high-risk country, and you have not been immunized against hepatitis B (hepatitis B vaccine).  You have HIV or AIDS.  You use needles to inject street drugs.  You live with someone who has hepatitis B.  You have had sex with someone who has hepatitis B.  You get hemodialysis treatment.  You take certain medicines for conditions, including cancer, organ transplantation, and autoimmune conditions. Hepatitis C  Blood testing is recommended for:  Everyone born from [birth year] through 1963.  Anyone with known risk factors for hepatitis C. Sexually transmitted infections (STIs)  You should be screened for sexually transmitted infections (STIs) including gonorrhea and chlamydia if:  You are sexually active and are younger than 38 years of age.  You are older than 38 years of age and your health care provider tells you that you are at risk for this type of infection.  Your sexual activity has changed since you were last screened and you are at an increased risk for chlamydia or gonorrhea. Ask your health care provider if you are at risk.  If you do not have HIV, but are at risk, it may be recommended that you take a prescription medicine daily to prevent HIV infection. This is called pre-exposure prophylaxis  (PrEP). You are considered at risk if:  You are sexually active and do not regularly use condoms or know the HIV status of your partner(s).  You take drugs by injection.  You are sexually active with a partner who has HIV. Talk with your health care provider about whether you are at high risk of being infected with HIV. If you choose to begin PrEP, you should first be tested for HIV. You should  then be tested every 3 months for as long as you are taking PrEP.  PREGNANCY   If you are premenopausal and you may become pregnant, ask your health care provider about preconception counseling.  If you may become pregnant, take 400 to 800 micrograms (mcg) of folic acid every day.  If you want to prevent pregnancy, talk to your health care provider about birth control (contraception). OSTEOPOROSIS AND MENOPAUSE   Osteoporosis is a disease in which the bones lose minerals and strength with aging. This can result in serious bone fractures. Your risk for osteoporosis can be identified using a bone density scan.  If you are 71 years of age or older, or if you are at risk for osteoporosis and fractures, ask your health care provider if you should be screened.  Ask your health care provider whether you should take a calcium or vitamin D supplement to lower your risk for osteoporosis.  Menopause may have certain physical symptoms and risks.  Hormone replacement therapy may reduce some of these symptoms and risks. Talk to your health care provider about whether hormone replacement therapy is right for you.  HOME CARE INSTRUCTIONS   Schedule regular health, dental, and eye exams.  Stay current with your immunizations.   Do not use any tobacco products including cigarettes, chewing tobacco, or electronic cigarettes.  If you are pregnant, do not drink alcohol.  If you are breastfeeding, limit how much and how often you drink alcohol.  Limit alcohol intake to no more than 1 drink per day for  nonpregnant women. One drink equals 12 ounces of beer, 5 ounces of wine, or 1 ounces of hard liquor.  Do not use street drugs.  Do not share needles.  Ask your health care provider for help if you need support or information about quitting drugs.  Tell your health care provider if you often feel depressed.  Tell your health care provider if you have ever been abused or do not feel safe at home.   This information is not intended to replace advice given to you by your health care provider. Make sure you discuss any questions you have with your health care provider.   Document Released: 02/16/2011 Document Revised: 08/24/2014 Document Reviewed: 07/05/2013 Elsevier Interactive Patient Education [employer].

## 2015-11-14 NOTE — Progress Notes (Signed)
Patient ID: Lynn Nelson, female    DOB: 1976/06/08  Age: 40 y.o. MRN: [id number]  CC: Annual Exam   HPI Lynn Nelson presents for Annual exam.   1) Annual Physical   Diet- Healthy diet   Exercise- active   Immunizations- UTD  PAP- [facility] over 3 yrs ago  Mammogram- [facility], mom had breast cancer   Eye Exam- Not UTD  Dental Exam- UTD  LMP- Currently menstruating  Labs- Today   Depression- Active tx for depression  Refills: Breaking down Buspirone 5 mg every 3 hours    History Lynn Nelson has a past medical history of Depression and Chicken pox.   She has past surgical history that includes Tubal ligation; Dilation and curettage of uterus; breast agumentation ; Eye surgery; and Breast surgery (2004).   Her family history includes Alcohol abuse in her paternal grandfather and paternal grandmother; Cancer in her mother, paternal grandfather, and paternal grandmother; Diabetes in her maternal grandmother; Heart disease in her father; Stroke in her maternal grandmother.She reports that she has never smoked. She has never used smokeless tobacco. She reports that she drinks about 1.8 oz of alcohol per week. She reports that she does not use illicit drugs.  Outpatient Prescriptions Prior to Visit  Medication Sig Dispense Refill  . busPIRone (BUSPAR) 10 MG tablet Take 1 tablet (10 mg total) by mouth 3 (three) times daily. 90 tablet 0   No facility-administered medications prior to visit.    ROS Review of Systems  Constitutional: Negative for fever, chills, diaphoresis, fatigue and unexpected weight change.  HENT: Negative for tinnitus and trouble swallowing.   Eyes: Negative for visual disturbance.  Respiratory: Negative for chest tightness, shortness of breath and wheezing.   Cardiovascular: Negative for chest pain, palpitations and leg swelling.  Gastrointestinal: Negative for nausea, vomiting, abdominal pain, diarrhea, constipation and blood in stool.   Endocrine: Negative for polydipsia, polyphagia and polyuria.  Genitourinary: Negative for dysuria, hematuria, vaginal discharge and vaginal pain.  Musculoskeletal: Negative for myalgias, back pain, arthralgias and gait problem.  Skin: Negative for color change and rash.  Neurological: Negative for dizziness, weakness, numbness and headaches.  Hematological: Does not bruise/bleed easily.  Psychiatric/Behavioral: Negative for suicidal ideas and sleep disturbance. The patient is not nervous/anxious.     Objective:  BP 100/60 mmHg  Pulse 60  Temp(Src) 98 F (36.7 C) (Oral)  Ht 5' 4" (1.626 m)  Wt 124 lb 5 oz (56.388 kg)  BMI 21.33 kg/m2  SpO2 99%  Physical Exam  Constitutional: She is oriented to person, place, and time. She appears well-developed and well-nourished. No distress.  HENT:  Head: Normocephalic and atraumatic.  Right Ear: External ear normal.  Left Ear: External ear normal.  Nose: Nose normal.  Mouth/Throat: Oropharynx is clear and moist. No oropharyngeal exudate.  TMs and canals clear bilaterally  Eyes: Conjunctivae and EOM are normal. Pupils are equal, round, and reactive to light. Right eye exhibits no discharge. Left eye exhibits no discharge. No scleral icterus.  Neck: Normal range of motion. Neck supple. No thyromegaly present.  Cardiovascular: Normal rate, regular rhythm, normal heart sounds and intact distal pulses.  Exam reveals no gallop and no friction rub.   No murmur heard. Pulmonary/Chest: Effort normal and breath sounds normal. No respiratory distress. She has no wheezes. She has no rales. She exhibits no tenderness.  Breast exam deferred   Abdominal: Soft. Bowel sounds are normal. She exhibits no distension and no mass. There is no tenderness.  There is no rebound and no guarding.  Genitourinary:  Deferred due to heavy menstruation  Musculoskeletal: Normal range of motion. She exhibits no edema or tenderness.  Lymphadenopathy:    She has no cervical  adenopathy.  Neurological: She is alert and oriented to person, place, and time. She has normal reflexes. No cranial nerve deficit. She exhibits normal muscle tone. Coordination normal.  Skin: Skin is warm and dry. No rash noted. She is not diaphoretic. No erythema. No pallor.  Psychiatric: She has a normal mood and affect. Her behavior is normal. Judgment and thought content normal.      Assessment & Plan:   Lynn Nelson was seen today for annual exam.  Diagnoses and all orders for this visit:  Annual physical exam -     Comp Met (CMET) -     Lipid Profile -     TSH -     CBC w/Diff -     HgB A1c -     Cancel: MM DIAG BREAST W/IMPLANT BILATERAL; Future  Screening for breast cancer -     Cancel: MM DIAG BREAST W/IMPLANT BILATERAL; Future -     MM SCREENING BREAST W/IMPLANT TOMO BILATERAL; Future  Complicated grieving   I am having Lynn Nelson maintain her busPIRone.  No orders of the defined types were placed in this encounter.     Follow-up: Return if symptoms worsen or fail to improve.

## 2015-11-14 NOTE — Progress Notes (Signed)
Pre visit review using our clinic review tool, if applicable. No additional management support is needed unless otherwise documented below in the visit note. 

## 2015-11-14 NOTE — Telephone Encounter (Signed)
Pt given form and a copy made and sent to scan.

## 2015-11-17 DIAGNOSIS — Z1239 Encounter for other screening for malignant neoplasm of breast: Secondary | ICD-10-CM | POA: Insufficient documentation

## 2015-11-17 DIAGNOSIS — Z0000 Encounter for general adult medical examination without abnormal findings: Secondary | ICD-10-CM | POA: Insufficient documentation

## 2015-11-17 NOTE — Assessment & Plan Note (Signed)
Discussed acute and chronic issues. Reviewed health maintenance measures, PFSHx, and immunizations. Obtain routine labs TSH, Lipid panel, CBC w/ diff, A1c, and CMET.    Needs mammogram for implants- mother has breast cancer  Encouraged eye exam

## 2015-11-17 NOTE — Assessment & Plan Note (Signed)
Stable on 5 mg buspirone currently  FU soon

## 2015-11-28 ENCOUNTER — Encounter: Payer: Self-pay | Admitting: Nurse Practitioner

## 2015-11-28 ENCOUNTER — Other Ambulatory Visit (HOSPITAL_COMMUNITY)
Admission: RE | Admit: 2015-11-28 | Discharge: 2015-11-28 | Disposition: A | Discharge status: Home / Self Care | Payer: Managed Care, Other (non HMO) | Source: Ambulatory Visit | Attending: Nurse Practitioner | Admitting: Nurse Practitioner

## 2015-11-28 ENCOUNTER — Ambulatory Visit (INDEPENDENT_AMBULATORY_CARE_PROVIDER_SITE_OTHER): Payer: Managed Care, Other (non HMO) | Admitting: Nurse Practitioner

## 2015-11-28 VITALS — BP 106/60 | HR 84 | Temp 98.2°F | Resp 12 | Ht 63.5 in | Wt 124.2 lb

## 2015-11-28 DIAGNOSIS — Z01419 Encounter for gynecological examination (general) (routine) without abnormal findings: Secondary | ICD-10-CM | POA: Diagnosis not present

## 2015-11-28 DIAGNOSIS — F4329 Adjustment disorder with other symptoms: Secondary | ICD-10-CM

## 2015-11-28 DIAGNOSIS — Z1151 Encounter for screening for human papillomavirus (HPV): Secondary | ICD-10-CM | POA: Diagnosis present

## 2015-11-28 DIAGNOSIS — F4321 Adjustment disorder with depressed mood: Secondary | ICD-10-CM | POA: Diagnosis not present

## 2015-11-28 DIAGNOSIS — Z634 Disappearance and death of family member: Secondary | ICD-10-CM

## 2015-11-28 MED ORDER — BUPROPION HCL ER (XL) 150 MG PO TB24: 150.0000 mg | ORAL_TABLET | Freq: Every day | ORAL | Status: DC
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
## 2015-11-28 NOTE — Progress Notes (Signed)
Pre visit review using our clinic review tool, if applicable. No additional management support is needed unless otherwise documented below in the visit note. 

## 2015-11-28 NOTE — Patient Instructions (Signed)
Bupropion extended-release tablets (Depression/Mood Disorders)  What is this medicine?  BUPROPION (byoo PROE pee on) is used to treat depression.  This medicine may be used for other purposes; ask your health care provider or pharmacist if you have questions.  What should I tell my health care provider before I take this medicine?  They need to know if you have any of these conditions:  -an eating disorder, such as anorexia or bulimia  -bipolar disorder or psychosis  -diabetes or high blood sugar, treated with medication  -glaucoma  -head injury or brain tumor  -heart disease, previous heart attack, or irregular heart beat  -high blood pressure  -kidney or liver disease  -seizures (convulsions)  -suicidal thoughts or a previous suicide attempt  -Tourette's syndrome  -weight loss  -an unusual or allergic reaction to bupropion, other medicines, foods, dyes, or preservatives  -breast-feeding  -pregnant or trying to become pregnant  How should I use this medicine?  Take this medicine by mouth with a glass of water. Follow the directions on the prescription label. You can take it with or without food. If it upsets your stomach, take it with food. Do not crush, chew, or cut these tablets. This medicine is taken once daily at the same time each day. Do not take your medicine more often than directed. Do not stop taking this medicine suddenly except upon the advice of your doctor. Stopping this medicine too quickly may cause serious side effects or your condition may worsen.  A special MedGuide will be given to you by the pharmacist with each prescription and refill. Be sure to read this information carefully each time.  Talk to your pediatrician regarding the use of this medicine in children. Special care may be needed.  Overdosage: If you think you have taken too much of this medicine contact a poison control center or emergency room at once.  NOTE: This medicine is only for you. Do not share this medicine with  others.  What if I miss a dose?  If you miss a dose, skip the missed dose and take your next tablet at the regular time. Do not take double or extra doses.  What may interact with this medicine?  Do not take this medicine with any of the following medications:  -linezolid  -MAOIs like Azilect, Carbex, Eldepryl, Marplan, Nardil, and Parnate  -methylene blue (injected into a vein)  -other medicines that contain bupropion like Zyban  This medicine may also interact with the following medications:  -alcohol  -certain medicines for anxiety or sleep  -certain medicines for blood pressure like metoprolol, propranolol  -certain medicines for depression or psychotic disturbances  -certain medicines for HIV or AIDS like efavirenz, lopinavir, nelfinavir, ritonavir  -certain medicines for irregular heart beat like propafenone, flecainide  -certain medicines for Parkinson's disease like amantadine, levodopa  -certain medicines for seizures like carbamazepine, phenytoin, phenobarbital  -cimetidine  -clopidogrel  -cyclophosphamide  -furazolidone  -isoniazid  -nicotine  -orphenadrine  -procarbazine  -steroid medicines like prednisone or cortisone  -stimulant medicines for attention disorders, weight loss, or to stay awake  -tamoxifen  -theophylline  -thiotepa  -ticlopidine  -tramadol  -warfarin  This list may not describe all possible interactions. Give your health care provider a list of all the medicines, herbs, non-prescription drugs, or dietary supplements you use. Also tell them if you smoke, drink alcohol, or use illegal drugs. Some items may interact with your medicine.  What should I watch for while using this medicine?    Tell your doctor if your symptoms do not get better or if they get worse. Visit your doctor or health care professional for regular checks on your progress. Because it may take several weeks to see the full effects of this medicine, it is important to continue your treatment as prescribed by your  doctor.  Patients and their families should watch out for new or worsening thoughts of suicide or depression. Also watch out for sudden changes in feelings such as feeling anxious, agitated, panicky, irritable, hostile, aggressive, impulsive, severely restless, overly excited and hyperactive, or not being able to sleep. If this happens, especially at the beginning of treatment or after a change in dose, call your health care professional.  Avoid alcoholic drinks while taking this medicine. Drinking large amounts of alcoholic beverages, using sleeping or anxiety medicines, or quickly stopping the use of these agents while taking this medicine may increase your risk for a seizure.  Do not drive or use heavy machinery until you know how this medicine affects you. This medicine can impair your ability to perform these tasks.  Do not take this medicine close to bedtime. It may prevent you from sleeping.  Your mouth may get dry. Chewing sugarless gum or sucking hard candy, and drinking plenty of water may help. Contact your doctor if the problem does not go away or is severe.  The tablet shell for some brands of this medicine does not dissolve. This is normal. The tablet shell may appear whole in the stool. This is not a cause for concern.  What side effects may I notice from receiving this medicine?  Side effects that you should report to your doctor or health care professional as soon as possible:  -allergic reactions like skin rash, itching or hives, swelling of the face, lips, or tongue  -breathing problems  -changes in vision  -confusion  -fast or irregular heartbeat  -hallucinations  -increased blood pressure  -redness, blistering, peeling or loosening of the skin, including inside the mouth  -seizures  -suicidal thoughts or other mood changes  -unusually weak or tired  -vomiting  Side effects that usually do not require medical attention (report to your doctor or health care professional if they continue or are  bothersome):  -change in sex drive or performance  -constipation  -headache  -loss of appetite  -nausea  -tremors  -weight loss  This list may not describe all possible side effects. Call your doctor for medical advice about side effects. You may report side effects to FDA at 1-800-FDA-1088.  Where should I keep my medicine?  Keep out of the reach of children.  Store at room temperature between 15 and 30 degrees C (59 and 86 degrees F). Throw away any unused medicine after the expiration date.  NOTE: This sheet is a summary. It may not cover all possible information. If you have questions about this medicine, talk to your doctor, pharmacist, or health care provider.     © 2016, [employer]. (2013-02-24 12:39:42)

## 2015-11-29 LAB — CYTOLOGY - PAP

## 2015-12-03 ENCOUNTER — Telehealth: Payer: Self-pay | Admitting: *Deleted

## 2015-12-03 ENCOUNTER — Other Ambulatory Visit: Payer: Self-pay | Admitting: Nurse Practitioner

## 2015-12-03 NOTE — Telephone Encounter (Signed)
Left a VM to return my call in the am for results

## 2015-12-03 NOTE — Telephone Encounter (Signed)
Patient requested her lab results from 11/28/15, please call after 4pm   She stated that she received her lab results in the mail, however missed a call today and was wondering if she should be concerned . Pt contact [phone]

## 2015-12-06 DIAGNOSIS — Z01419 Encounter for gynecological examination (general) (routine) without abnormal findings: Secondary | ICD-10-CM | POA: Insufficient documentation

## 2015-12-06 NOTE — Assessment & Plan Note (Signed)
PAP was performed today  No significant findings on exam

## 2015-12-06 NOTE — Assessment & Plan Note (Signed)
Patient reports the buspirone 5 mg and 10 mg are not helpful for her Patient reports obsessive thoughts and cleaning obsessively as well. She is willing to try Wellbutrin 150 mg once daily in the mornings. Follow-up in 4 weeks

## 2015-12-09 ENCOUNTER — Ambulatory Visit: Payer: Self-pay | Admitting: Nurse Practitioner

## 2015-12-12 ENCOUNTER — Ambulatory Visit
Admission: RE | Admit: 2015-12-12 | Discharge: 2015-12-12 | Disposition: A | Discharge status: Home / Self Care | Payer: Managed Care, Other (non HMO) | Source: Ambulatory Visit | Attending: Nurse Practitioner | Admitting: Nurse Practitioner

## 2015-12-12 DIAGNOSIS — Z1231 Encounter for screening mammogram for malignant neoplasm of breast: Secondary | ICD-10-CM | POA: Diagnosis not present

## 2015-12-12 DIAGNOSIS — Z1239 Encounter for other screening for malignant neoplasm of breast: Secondary | ICD-10-CM

## 2015-12-26 ENCOUNTER — Ambulatory Visit (INDEPENDENT_AMBULATORY_CARE_PROVIDER_SITE_OTHER): Payer: Managed Care, Other (non HMO) | Admitting: Nurse Practitioner

## 2015-12-26 ENCOUNTER — Encounter: Payer: Self-pay | Admitting: Nurse Practitioner

## 2015-12-26 VITALS — BP 98/54 | HR 78 | Temp 98.1°F | Resp 14 | Ht 63.5 in | Wt 123.0 lb

## 2015-12-26 DIAGNOSIS — Z634 Disappearance and death of family member: Principal | ICD-10-CM

## 2015-12-26 DIAGNOSIS — F4321 Adjustment disorder with depressed mood: Secondary | ICD-10-CM

## 2015-12-26 DIAGNOSIS — F4329 Adjustment disorder with other symptoms: Secondary | ICD-10-CM

## 2015-12-26 MED ORDER — BUPROPION HCL ER (XL) 300 MG PO TB24: 300.0000 mg | ORAL_TABLET | Freq: Every day | ORAL | Status: DC

## 2015-12-26 NOTE — Progress Notes (Signed)
Patient ID: Lynn Nelson, female    DOB: 1975-10-23  Age: 40 y.o. MRN: [id number]  CC: Follow-up   HPI Lynn Nelson presents for follow up of grief and depression.   1) Wellbutrin making her not helpful at this time, but denies side effects. Denies SI/HI Whole family started with hospice counseling  Vitamins starting this week to help with energy level Intermittent sleep disturbance  Pt reports xanax- makes her too fatigued   History Oshyn has a past medical history of Depression and Chicken pox.   She has past surgical history that includes Tubal ligation; Dilation and curettage of uterus; breast agumentation ; Eye surgery; Breast surgery (2004); and Augmentation mammaplasty (Bilateral).   Her family history includes Alcohol abuse in her paternal grandfather and paternal grandmother; Breast cancer (age of onset: 61) in her mother; Cancer in her mother, paternal grandfather, and paternal grandmother; Diabetes in her maternal grandmother; Heart disease in her father; Stroke in her maternal grandmother.She reports that she has never smoked. She has never used smokeless tobacco. She reports that she drinks about 1.8 oz of alcohol per week. She reports that she does not use illicit drugs.  Outpatient Prescriptions Prior to Visit  Medication Sig Dispense Refill  . buPROPion (WELLBUTRIN XL) 150 MG 24 hr tablet Take 1 tablet (150 mg total) by mouth daily. 30 tablet 1  . busPIRone (BUSPAR) 10 MG tablet TAKE 1 TABLET (10 MG TOTAL) BY MOUTH 3 (THREE) TIMES DAILY. 90 tablet 0   No facility-administered medications prior to visit.    ROS Review of Systems  Constitutional: Negative for fever, chills, diaphoresis, activity change, appetite change, fatigue and unexpected weight change.  Eyes: Negative for visual disturbance.  Respiratory: Negative for chest tightness and shortness of breath.   Cardiovascular: Negative for chest pain.  Gastrointestinal: Negative for nausea, vomiting and  diarrhea.  Neurological: Negative for headaches.  Psychiatric/Behavioral: Positive for sleep disturbance. Negative for suicidal ideas. The patient is nervous/anxious.     Objective:  BP 98/54 mmHg  Pulse 78  Temp(Src) 98.1 F (36.7 C) (Oral)  Resp 14  Ht 5' 3.5" (1.613 m)  Wt 123 lb (55.792 kg)  BMI 21.44 kg/m2  SpO2 98%  LMP 11/14/2015  Physical Exam  Constitutional: She is oriented to person, place, and time. She appears well-developed and well-nourished. No distress.  HENT:  Head: Normocephalic and atraumatic.  Right Ear: External ear normal.  Left Ear: External ear normal.  Cardiovascular: Normal rate, regular rhythm and normal heart sounds.   Pulmonary/Chest: Effort normal and breath sounds normal. No respiratory distress. She has no wheezes. She has no rales. She exhibits no tenderness.  Neurological: She is alert and oriented to person, place, and time. No cranial nerve deficit. She exhibits normal muscle tone. Coordination normal.  Skin: Skin is warm and dry. No rash noted. She is not diaphoretic.  Psychiatric: She has a normal mood and affect. Her behavior is normal. Judgment and thought content normal.   Assessment & Plan:   Ogechukwu was seen today for follow-up.  Diagnoses and all orders for this visit:  Complicated grieving  Other orders -     buPROPion (WELLBUTRIN XL) 300 MG 24 hr tablet; Take 1 tablet (300 mg total) by mouth daily.  I have discontinued Ms. Rakes's buPROPion and busPIRone. I am also having her start on buPROPion.  Meds ordered this encounter  Medications  . buPROPion (WELLBUTRIN XL) 300 MG 24 hr tablet    Sig: Take 1 tablet (  300 mg total) by mouth daily.    Dispense:  30 tablet    Refill:  1    Order Specific Question:  Supervising Provider    Answer:  Crecencio Mc [[id number]]     Follow-up: Return in about 6 weeks (around 02/06/2016) for Follow up w/ New PCP.

## 2015-12-26 NOTE — Patient Instructions (Signed)
It was lovely getting to know you! Good luck moving forward and in the future.

## 2015-12-26 NOTE — Assessment & Plan Note (Addendum)
Est. Uncontrolled- Moving up to Wellbutrin 300 XL trying for the next few weeks  Follow up with new provider  Encouraged pt to continue with counseling

## 2016-02-06 ENCOUNTER — Ambulatory Visit (INDEPENDENT_AMBULATORY_CARE_PROVIDER_SITE_OTHER): Payer: Managed Care, Other (non HMO) | Admitting: Family Medicine

## 2016-02-06 ENCOUNTER — Encounter: Payer: Self-pay | Admitting: Family Medicine

## 2016-02-06 VITALS — BP 106/68 | HR 77 | Temp 98.5°F | Ht 63.5 in | Wt 123.2 lb

## 2016-02-06 DIAGNOSIS — F4329 Adjustment disorder with other symptoms: Secondary | ICD-10-CM

## 2016-02-06 DIAGNOSIS — F4321 Adjustment disorder with depressed mood: Secondary | ICD-10-CM

## 2016-02-06 DIAGNOSIS — Z634 Disappearance and death of family member: Principal | ICD-10-CM

## 2016-02-06 MED ORDER — BUSPIRONE HCL 7.5 MG PO TABS: 7.5000 mg | ORAL_TABLET | Freq: Two times a day (BID) | ORAL | Status: DC

## 2016-02-06 MED ORDER — VENLAFAXINE HCL ER 37.5 MG PO CP24: 37.5000 mg | ORAL_CAPSULE | Freq: Every day | ORAL | Status: DC

## 2016-02-06 MED ORDER — BUPROPION HCL ER (XL) 150 MG PO TB24: 150.0000 mg | ORAL_TABLET | Freq: Every day | ORAL | Status: DC

## 2016-02-06 NOTE — Progress Notes (Signed)
Patient ID: Lynn Nelson, female   DOB: June 15, 1976, 40 y.o.   MRN: [id number]  Lynn Rumps, MD Phone: [phone]  Lynn Nelson is a 40 y.o. female who presents today for follow-up.  Patient presents for follow-up for complicated grieving. She lost her husband earlier this year in a car accident. Per review of notes it appears that they were separated at that time. She has been having a difficult time with this. Her child is also had a difficult time with this. They're both seeing hospice counselors regarding this. Patient has been on Wellbutrin for this. She does not think this helps at all. She is interested in trying Effexor and BuSpar in combination. She notes the biggest issue now is that she has been crying randomly particularly if she has lots of time to think about this.  PMH: nonsmoker.   ROS see history of present illness  Objective  Physical Exam Filed Vitals:   02/06/16 0804  BP: 106/68  Pulse: 77  Temp: 98.5 F (36.9 C)    BP Readings from Last 3 Encounters:  02/06/16 106/68  12/26/15 98/54  11/28/15 106/60   Wt Readings from Last 3 Encounters:  02/06/16 123 lb 3.2 oz (55.883 kg)  12/26/15 123 lb (55.792 kg)  11/28/15 124 lb 3.2 oz (56.337 kg)    Physical Exam  Constitutional: She is well-developed, well-nourished, and in no distress.  HENT:  Head: Normocephalic and atraumatic.  Right Ear: External ear normal.  Left Ear: External ear normal.  Cardiovascular: Normal rate, regular rhythm and normal heart sounds.   Pulmonary/Chest: Effort normal and breath sounds normal.  Skin: She is not diaphoretic.  Psychiatric:  Mood anxious and depressed, affect flat     Assessment/Plan: Please see individual problem list.  Complicated grieving Not improving. Given lack of improvement on Wellbutrin we will trial of Effexor and BuSpar. Unlikely to withdraw from Wellbutrin though we will taper over the next week. She'll decrease dosing to 150 mg daily and  then stop and monitor for symptoms. If depression or anxiety worsens we will extend the taper. Should be on tolerate taking Effexor and Wellbutrin concurrently while tapering. She will continue to follow with hospice. She'll continue to monitor. She'll follow-up in one month. She's given return precautions.    No orders of the defined types were placed in this encounter.    Meds ordered this encounter  Medications  . venlafaxine XR (EFFEXOR XR) 37.5 MG 24 hr capsule    Sig: Take 1 capsule (37.5 mg total) by mouth daily with breakfast.    Dispense:  90 capsule    Refill:  1  . busPIRone (BUSPAR) 7.5 MG tablet    Sig: Take 1 tablet (7.5 mg total) by mouth 2 (two) times daily.    Dispense:  60 tablet    Refill:  3  . buPROPion (WELLBUTRIN XL) 150 MG 24 hr tablet    Sig: Take 1 tablet (150 mg total) by mouth daily.    Dispense:  14 tablet    Refill:  0    Lynn Rumps, MD [employer]

## 2016-02-06 NOTE — Assessment & Plan Note (Signed)
Not improving. Given lack of improvement on Wellbutrin we will trial of Effexor and BuSpar. Unlikely to withdraw from Wellbutrin though we will taper over the next week. She'll decrease dosing to 150 mg daily and then stop and monitor for symptoms. If depression or anxiety worsens we will extend the taper. Should be on tolerate taking Effexor and Wellbutrin concurrently while tapering. She will continue to follow with hospice. She'll continue to monitor. She'll follow-up in one month. She's given return precautions.

## 2016-02-06 NOTE — Progress Notes (Signed)
Pre visit review using our clinic review tool, if applicable. No additional management support is needed unless otherwise documented below in the visit note. 

## 2016-02-06 NOTE — Patient Instructions (Signed)
Nice to meet you. We're going to have you taper down on her Wellbutrin. She will take 150 mg for the next week and then discontinue the medication. He should be wary of possible withdrawal symptoms sitting with the following: Nausea or vomiting Intense and frequent dreams Headache Vertigo Changes to senses and perception Numbness of extremities We will start you on Effexor and BuSpar to take the place of the Wellbutrin to see if this will be beneficial. Please continue to see your counselor through hospice. If you develop thoughts of harming herself or others are worsening depression please seek medical attention.

## 2016-02-10 ENCOUNTER — Telehealth: Payer: Self-pay | Admitting: Family Medicine

## 2016-02-10 ENCOUNTER — Encounter: Payer: Self-pay | Admitting: Surgical

## 2016-02-10 MED ORDER — ALPRAZOLAM 0.25 MG PO TABS: 0.2500 mg | ORAL_TABLET | Freq: Two times a day (BID) | ORAL | Status: DC | PRN

## 2016-02-10 MED ORDER — BUPROPION HCL ER (XL) 300 MG PO TB24: 300.0000 mg | ORAL_TABLET | Freq: Every day | ORAL | Status: DC

## 2016-02-10 NOTE — Telephone Encounter (Signed)
Given lack of sleep and transition back to wellbutrin I would advise waiting to go back to work until tomorrow. We can provide her with a work note for today.

## 2016-02-10 NOTE — Telephone Encounter (Signed)
Please advise 

## 2016-02-10 NOTE — Telephone Encounter (Signed)
She can go back to Wellbutrin 300 mg and it is reasonable to trial low dose xanax. This could make her drowsy so she should not take this and operate machinery or drive after taking within 8 hours or if she feels drowsy. Thanks.

## 2016-02-10 NOTE — Telephone Encounter (Signed)
Pt states that the busPIRone (BUSPAR) 7.5 MG tablet and the venlafaxine XR (EFFEXOR XR) 37.5 MG 24 hr capsule are causing her to have insomnia (she has slept 3hrs in the past 4days).. Pt stopped taking them and started back on the [facility].Marland Kitchen Pt is also working and wants to know if its safe for her to be working.. Please advise pt

## 2016-02-10 NOTE — Telephone Encounter (Signed)
Patient would like to know if it is ok for her to start taking 300 MG of the Wellbutrin and start taking a low dose of Xanax. If so she will need a RX for these. I will put a work note up front for her to go back to work tomorrow.

## 2016-02-10 NOTE — Telephone Encounter (Signed)
Notified patient that RX was sent to pharmacy. Explained the message.

## 2016-02-11 ENCOUNTER — Telehealth: Payer: Self-pay | Admitting: Family Medicine

## 2016-02-11 NOTE — Telephone Encounter (Signed)
Pt called about her Rx not at the pharmacy for buPROPion (WELLBUTRIN XL) 300 MG 24 hr tablet.  Pharmacy is [id number] - [place], [place] [address]  Call pt @ [phone]. Thank you!

## 2016-02-11 NOTE — Telephone Encounter (Signed)
Called the pharmacy and the prescription is there, but not able to be picked up until the 3rd of July, spoke with patient to let her know. thanks

## 2016-02-11 NOTE — Telephone Encounter (Signed)
Ok

## 2016-02-27 ENCOUNTER — Telehealth: Payer: Self-pay | Admitting: Family Medicine

## 2016-02-27 NOTE — Telephone Encounter (Signed)
Pt is calling about a refill on her ALPRAZolam (XANAX) 0.25 MG tablet. She is requesting a higher dose.

## 2016-02-27 NOTE — Telephone Encounter (Signed)
Patient is requesting a higher dose with refill, thanks

## 2016-02-28 ENCOUNTER — Other Ambulatory Visit: Payer: Self-pay | Admitting: Family Medicine

## 2016-02-28 NOTE — Telephone Encounter (Signed)
Pt called her Rx is not at the pharmacy.   Call pt @ [phone]. Thank you!

## 2016-02-28 NOTE — Telephone Encounter (Signed)
Filled thanks.

## 2016-03-02 NOTE — Telephone Encounter (Signed)
Pt called about her refill was at the same dose and mg. Pt stated she wants a higher dosage.   Call pt @ [phone]. Thank you!

## 2016-03-02 NOTE — Telephone Encounter (Signed)
Patient is referring to the Xanax dose, please advise if it can be increased to a higher dose, thanks

## 2016-03-02 NOTE — Telephone Encounter (Signed)
He would like to see her to discuss this, her follow up can be moved up if needed.  Thanks.

## 2016-03-02 NOTE — Telephone Encounter (Signed)
Pt was rescheduled for 07/18.

## 2016-03-02 NOTE — Telephone Encounter (Signed)
It appears she has follow-up later this month. We will need to discuss increased dosage at a follow-up appointment. Her appointment can be moved up if needed.

## 2016-03-03 ENCOUNTER — Encounter: Payer: Self-pay | Admitting: Family Medicine

## 2016-03-03 ENCOUNTER — Ambulatory Visit (INDEPENDENT_AMBULATORY_CARE_PROVIDER_SITE_OTHER): Payer: Managed Care, Other (non HMO) | Admitting: Family Medicine

## 2016-03-03 VITALS — BP 104/66 | HR 72 | Temp 98.5°F | Ht 63.5 in | Wt 123.2 lb

## 2016-03-03 DIAGNOSIS — F4321 Adjustment disorder with depressed mood: Secondary | ICD-10-CM | POA: Diagnosis not present

## 2016-03-03 DIAGNOSIS — F4329 Adjustment disorder with other symptoms: Secondary | ICD-10-CM

## 2016-03-03 DIAGNOSIS — Z634 Disappearance and death of family member: Principal | ICD-10-CM

## 2016-03-03 MED ORDER — ALPRAZOLAM 0.5 MG PO TABS: 0.5000 mg | ORAL_TABLET | Freq: Three times a day (TID) | ORAL | Status: DC | PRN

## 2016-03-03 MED ORDER — BUPROPION HCL ER (XL) 450 MG PO TB24: 450.0000 mg | ORAL_TABLET | Freq: Every day | ORAL | Status: DC

## 2016-03-03 NOTE — Patient Instructions (Signed)
Nice to see you. We are going to increase her Wellbutrin to 450 mg daily. We'll increase her Xanax to 0.5 mg 3 times daily as needed. Please contact your counselor. If you develop worsening anxiety or depression please let us know. If you develop thoughts of harming herself or others please seek medical attention immediately.

## 2016-03-03 NOTE — Assessment & Plan Note (Signed)
This issue is relatively stable. She is back on Wellbutrin. Discussed management with Wellbutrin versus other medications. She opted to increase to the max dose Wellbutrin. Xanax has been helpful and she has been taking 0.5 mg twice daily recently. We will increase her frequency to 0.5 mg 3 times a day as needed. She will contact hospice counseling to get back into see them. I will see her back in a month. She is given return precautions.

## 2016-03-03 NOTE — Progress Notes (Signed)
Tommi Rumps, MD Phone: [phone]  Lynn Nelson is a 40 y.o. female who presents today for follow-up.  Anxiety/depression/grief: Patient notes she is doing about the same. Has had frequent crying spells anytime she thinks about her husband's death. Trying to stay strong for her children and her mother-in-law. Has not seen the hospice counselor in the last 3 weeks. Notes she tried the Effexor though she could not sleep while on that for about 4 days so she went back on the Wellbutrin. Has been tolerating this okay. Wants know if she can go up on the dose. Also taking Xanax. Has been taking 0.5 mg twice daily typically with this. Does not make her drowsy.  Depression screen [facility] 2/9 03/03/2016 02/06/2016 10/10/2015  Decreased Interest 2 2 2   Down, Depressed, Hopeless 1 1 1   PHQ - 2 Score 3 3 3   Altered sleeping 2 1 2   Tired, decreased energy 1 3 3   Change in appetite 1 1 3   Feeling bad or failure about yourself  0 0 0  Trouble concentrating 1 1 3   Moving slowly or fidgety/restless 1 2 1   Suicidal thoughts 0 0 0  PHQ-9 Score 9 11 15   Difficult doing work/chores Somewhat difficult Somewhat difficult Somewhat difficult   GAD 7 : Generalized Anxiety Score 03/03/2016 02/06/2016  Nervous, Anxious, on Edge 2 2  Control/stop worrying 1 2  Worry too much - different things 1 2  Trouble relaxing 2 2  Restless 2 3  Easily annoyed or irritable 2 2  Afraid - awful might happen 2 2  Total GAD 7 Score 12 15  Anxiety Difficulty Very difficult Somewhat difficult     PMH: nonsmoker    ROS see history of present illness  Objective  Physical Exam Filed Vitals:   03/03/16 0841  BP: 104/66  Pulse: 72  Temp: 98.5 F (36.9 C)    BP Readings from Last 3 Encounters:  03/03/16 104/66  02/06/16 106/68  12/26/15 98/54   Wt Readings from Last 3 Encounters:  03/03/16 123 lb 3.2 oz (55.883 kg)  02/06/16 123 lb 3.2 oz (55.883 kg)  12/26/15 123 lb (55.792 kg)    Physical Exam    Constitutional: No distress.  HENT:  Head: Normocephalic and atraumatic.  Cardiovascular: Normal rate, regular rhythm and normal heart sounds.   Pulmonary/Chest: Effort normal and breath sounds normal.  Neurological: She is alert. Gait normal.  Skin: She is not diaphoretic.  Psychiatric:  Mood depressed and anxious, affect intermittently flat and tearful, though occasionally smiles     Assessment/Plan: Please see individual problem list.  Complicated grieving This issue is relatively stable. She is back on Wellbutrin. Discussed management with Wellbutrin versus other medications. She opted to increase to the max dose Wellbutrin. Xanax has been helpful and she has been taking 0.5 mg twice daily recently. We will increase her frequency to 0.5 mg 3 times a day as needed. She will contact hospice counseling to get back into see them. I will see her back in a month. She is given return precautions.    No orders of the defined types were placed in this encounter.    Meds ordered this encounter  Medications  . buPROPion 450 MG TB24    Sig: Take 450 mg by mouth daily.    Dispense:  30 tablet    Refill:  1  . ALPRAZolam (XANAX) 0.5 MG tablet    Sig: Take 1 tablet (0.5 mg total) by mouth 3 (three) times  daily as needed for anxiety.    Dispense:  60 tablet    Refill:  0    Not to exceed 5 additional fills before 08/08/2016.    Tommi Rumps, MD [place]

## 2016-03-03 NOTE — Progress Notes (Signed)
Pre visit review using our clinic review tool, if applicable. No additional management support is needed unless otherwise documented below in the visit note. 

## 2016-03-12 ENCOUNTER — Ambulatory Visit: Payer: Self-pay | Admitting: Family Medicine

## 2016-03-25 ENCOUNTER — Telehealth: Payer: Self-pay | Admitting: *Deleted

## 2016-03-25 MED ORDER — ALPRAZOLAM 0.5 MG PO TABS: 0.5000 mg | ORAL_TABLET | Freq: Three times a day (TID) | ORAL | 0 refills | Status: DC | PRN

## 2016-03-25 NOTE — Telephone Encounter (Signed)
Refill given

## 2016-03-25 NOTE — Telephone Encounter (Signed)
Patient requested a medication refill for Xanax

## 2016-03-25 NOTE — Telephone Encounter (Signed)
Notified pt. Rx sent to pharmacy.  

## 2016-03-25 NOTE — Telephone Encounter (Signed)
Last OV and Last refill on 03/03/16, #60 with 0 refills... Okay to refill?

## 2016-03-31 ENCOUNTER — Other Ambulatory Visit: Payer: Self-pay | Admitting: Family Medicine

## 2016-03-31 ENCOUNTER — Telehealth: Payer: Self-pay | Admitting: Family Medicine

## 2016-03-31 MED ORDER — BUPROPION HCL ER (XL) 450 MG PO TB24: 450.0000 mg | ORAL_TABLET | Freq: Every day | ORAL | 1 refills | Status: DC

## 2016-03-31 NOTE — Telephone Encounter (Signed)
Ok. I called pt and left a vm to call the office. Thank you! °

## 2016-03-31 NOTE — Telephone Encounter (Signed)
Pt called back and was given the information regarding Rx. Thank you!

## 2016-03-31 NOTE — Telephone Encounter (Signed)
Refill sent.

## 2016-03-31 NOTE — Telephone Encounter (Signed)
Pt called needing a refill for Wellbutrin. 450 mg  [id number] - [place], [place] - [address]  Call pt @ [phone]. Thank you!

## 2016-04-01 ENCOUNTER — Ambulatory Visit (INDEPENDENT_AMBULATORY_CARE_PROVIDER_SITE_OTHER): Payer: Managed Care, Other (non HMO) | Admitting: Family Medicine

## 2016-04-01 ENCOUNTER — Encounter (HOSPITAL_COMMUNITY): Payer: Self-pay

## 2016-04-01 VITALS — BP 108/70 | HR 84 | Temp 98.7°F | Wt 124.0 lb

## 2016-04-01 DIAGNOSIS — F4321 Adjustment disorder with depressed mood: Secondary | ICD-10-CM | POA: Diagnosis not present

## 2016-04-01 DIAGNOSIS — F4329 Adjustment disorder with other symptoms: Secondary | ICD-10-CM

## 2016-04-01 DIAGNOSIS — Z634 Disappearance and death of family member: Principal | ICD-10-CM

## 2016-04-01 MED ORDER — ALPRAZOLAM 1 MG PO TABS: 1.0000 mg | ORAL_TABLET | Freq: Three times a day (TID) | ORAL | 0 refills | Status: DC | PRN

## 2016-04-01 NOTE — Progress Notes (Signed)
Pre visit review using our clinic review tool, if applicable. No additional management support is needed unless otherwise documented below in the visit note. 

## 2016-04-01 NOTE — Patient Instructions (Signed)
Nice to see you. We will increase your Xanax dose to 1 mg 3 times daily as needed for anxiety. If you get excessively drowsy with this or have altered thinking with this he should let us know and did not go to work. 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 We will refer you to psychiatry and therapist. If you have worsening symptoms or develop thoughts of harming your self please seek medical attention immediately.

## 2016-04-01 NOTE — Progress Notes (Signed)
Tommi Rumps, MD Phone: [phone]  Lynn Nelson is a 40 y.o. female who presents today for follow-up.  Patient notes her anxiety has worsened and her grief is uncontrolled. She notes it is caused her to have issues functioning in public. No she gets incredibly nervous and shaky when around people and in public. Note she has been taking 1 mg of Xanax several times a day to help with this instead of the 0.5 mg of Xanax has been prescribed. Xanax does not make her drowsy. It helps her function. Does not affect her work when she works on [employer] se[employer]. No SI. Has not been able to get back in with her counselor and wants to know if she can see someone else. She also wants to see if she can get her son in to see somebody in the same group that we send her to. She does note her son has been expressing a desire to have a pocket knife though has not expressed any self-harm behavior or expressed any ideas of self-harm. He had been seeing hospice counseling as well.   ROS see history of present illness  Objective  Physical Exam Vitals:   04/01/16 1119  BP: 108/70  Pulse: 84  Temp: 98.7 F (37.1 C)    BP Readings from Last 3 Encounters:  04/01/16 108/70  03/03/16 104/66  02/06/16 106/68   Wt Readings from Last 3 Encounters:  04/01/16 124 lb (56.2 kg)  03/03/16 123 lb 3.2 oz (55.9 kg)  02/06/16 123 lb 3.2 oz (55.9 kg)    Physical Exam  Constitutional: No distress.  Cardiovascular: Normal rate, regular rhythm and normal heart sounds.   Pulmonary/Chest: Effort normal and breath sounds normal.  Neurological: She is alert. Gait normal.  Skin: She is not diaphoretic.  Psychiatric:  Mood depressed, affect depressed     Assessment/Plan: Please see individual problem list.  Complicated grieving Patient's issues with grieving have worsened since her last visit. She notes no SI. Has been taking higher doses of Xanax than prescribed though this seems to have been beneficial. I discussed  given that she has required increasing doses we will need to refer her for psychiatry evaluation and to see a psychologist for further help in managing her grief. We will increase her Xanax prescription to 1 mg 3 times daily as needed for anxiety. She'll continue Wellbutrin. I did discuss that the Xanax could potentially make her drowsy and alter her thought processes. If this occurs she needs to let us know. 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 I encouraged her to get her son set up with a therapist for evaluation as well given they're both dealing with grief from the loss of her husband. Discussed not providing him with a pocket knife at this time and she agreed and that if he were to exhibit any self-harm behaviors or thoughts he should be evaluated immediately. She is given return precautions.   Orders Placed This Encounter  Procedures  . Ambulatory referral to Psychiatry    Referral Priority:   Routine    Referral Type:   Psychiatric    Referral Reason:   Specialty Services Required    Requested Specialty:   Psychiatry    Number of Visits Requested:   1  . Ambulatory referral to Psychology    Referral Priority:   Routine    Referral Type:   Psychiatric    Referral Reason:   Specialty Services Required  Requested Specialty:   Psychology    Number of Visits Requested:   1    Meds ordered this encounter  Medications  . ALPRAZolam (XANAX) 1 MG tablet    Sig: Take 1 tablet (1 mg total) by mouth 3 (three) times daily as needed for anxiety.    Dispense:  90 tablet    Refill:  0    Not to exceed 5 additional fills before 08/08/2016.    Tommi Rumps, MD [place]

## 2016-04-01 NOTE — Assessment & Plan Note (Signed)
Patient's issues with grieving have worsened since her last visit. She notes no SI. Has been taking higher doses of Xanax than prescribed though this seems to have been beneficial. I discussed given that she has required increasing doses we will need to refer her for psychiatry evaluation and to see a psychologist for further help in managing her grief. We will increase her Xanax prescription to 1 mg 3 times daily as needed for anxiety. She'll continue Wellbutrin. I did discuss that the Xanax could potentially make her drowsy and alter her thought processes. If this occurs she needs to let us know. 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 I encouraged her to get her son set up with a therapist for evaluation as well given they're both dealing with grief from the loss of her husband. Discussed not providing him with a pocket knife at this time and she agreed and that if he were to exhibit any self-harm behaviors or thoughts he should be evaluated immediately. She is given return precautions.

## 2016-04-03 ENCOUNTER — Telehealth: Payer: Self-pay | Admitting: Family Medicine

## 2016-04-03 ENCOUNTER — Telehealth: Payer: Self-pay | Admitting: *Deleted

## 2016-04-03 NOTE — Telephone Encounter (Signed)
I am aware. She had been taking two 0.5 mg tablets 3 times a day prior to coming in thus I increased her medication. I wrote the prescription to represent a higher dose. It is okay for them to fill this prescription.

## 2016-04-03 NOTE — Telephone Encounter (Signed)
See previous message created.

## 2016-04-03 NOTE — Telephone Encounter (Signed)
[facility] [phone] called to check if it's ok to fill ALPRAZolam (XANAX) 1 MG tablet pt got 60 tablets of the .5 mg that was 10 days ago. Pt brought in a Rx for a higher dose is it ok to fill?   Please advise?

## 2016-04-03 NOTE — Telephone Encounter (Signed)
Pharmacy was called and given verbal okay to refill per Dr.Sonneberg's request.

## 2016-04-03 NOTE — Telephone Encounter (Signed)
Are you aware of increase if so do you given okay to refill?

## 2016-04-03 NOTE — Telephone Encounter (Signed)
Patient stated that [facility] requested that Dr.Sonnenberg to call to confirm the dosage on the Xanax to be filled  Pt contact [phone]

## 2016-04-13 ENCOUNTER — Ambulatory Visit: Payer: Self-pay | Admitting: Family Medicine

## 2016-04-16 ENCOUNTER — Ambulatory Visit: Payer: Self-pay | Admitting: Licensed Clinical Social Worker

## 2016-04-27 ENCOUNTER — Other Ambulatory Visit: Payer: Self-pay | Admitting: Family Medicine

## 2016-04-27 NOTE — Telephone Encounter (Signed)
Can we fill this?

## 2016-04-29 ENCOUNTER — Ambulatory Visit: Payer: Self-pay | Admitting: Licensed Clinical Social Worker

## 2016-05-04 ENCOUNTER — Ambulatory Visit: Payer: Self-pay | Admitting: Family Medicine

## 2016-10-05 ENCOUNTER — Encounter: Payer: Self-pay | Admitting: Family Medicine

## 2016-10-05 ENCOUNTER — Ambulatory Visit (INDEPENDENT_AMBULATORY_CARE_PROVIDER_SITE_OTHER): Payer: Managed Care, Other (non HMO) | Admitting: Family Medicine

## 2016-10-05 DIAGNOSIS — Z634 Disappearance and death of family member: Secondary | ICD-10-CM | POA: Diagnosis not present

## 2016-10-05 DIAGNOSIS — F4321 Adjustment disorder with depressed mood: Secondary | ICD-10-CM

## 2016-10-05 DIAGNOSIS — F4329 Adjustment disorder with other symptoms: Secondary | ICD-10-CM | POA: Diagnosis not present

## 2016-10-05 DIAGNOSIS — J40 Bronchitis, not specified as acute or chronic: Secondary | ICD-10-CM

## 2016-10-05 MED ORDER — AZITHROMYCIN 250 MG PO TABS: ORAL_TABLET | ORAL | 0 refills | Status: DC

## 2016-10-05 MED ORDER — BENZONATATE 200 MG PO CAPS: 200.0000 mg | ORAL_CAPSULE | Freq: Two times a day (BID) | ORAL | 0 refills | Status: DC | PRN

## 2016-10-05 NOTE — Progress Notes (Signed)
  Tommi Rumps, MD Phone: [phone]  Lynn Nelson is a 41 y.o. female who presents today for same-day visit.  Patient notes 3 weeks of cough and chest congestion. Notes fevers at home. Feels drained. Congestion in her chest. Minimally short of breath at times. Notes some soreness in her ribs and back with coughing though no other pain. Notes some postnasal drip. No upper respiratory or sinus congestion. She has taken DayQuil and NyQuil and ibuprofen with little benefit.  She also reports she is doing significantly better with her grief. She came off the Xanax and went with family support and prayer as ways to treat herself. She feels as though she just needed time to heal and is doing quite a bit better.  PMH: nonsmoker.   ROS see history of present illness  Objective  Physical Exam Vitals:   10/05/16 0825  BP: 106/82  Pulse: 80  Temp: 99.3 F (37.4 C)    BP Readings from Last 3 Encounters:  10/05/16 106/82  04/01/16 108/70  03/03/16 104/66   Wt Readings from Last 3 Encounters:  10/05/16 128 lb 3.2 oz (58.2 kg)  04/01/16 124 lb (56.2 kg)  03/03/16 123 lb 3.2 oz (55.9 kg)    Physical Exam  Constitutional: No distress.  HENT:  Head: Normocephalic and atraumatic.  Mouth/Throat: Oropharynx is clear and moist. No oropharyngeal exudate.  Normal TMs bilaterally  Eyes: Conjunctivae are normal. Pupils are equal, round, and reactive to light.  Cardiovascular: Normal rate, regular rhythm and normal heart sounds.   Pulmonary/Chest: Effort normal and breath sounds normal.  Neurological: She is alert. Gait normal.  Skin: Skin is warm and dry. She is not diaphoretic.     Assessment/Plan: Please see individual problem list.  Complicated grieving Doing significantly better. No longer on any medication for this. She'll continue to monitor.  Bronchitis Symptoms most consistent with bronchitis. Given duration and persistent intermittent fevers we will treat with  azithromycin. No focal findings on lung exam to indicate pneumonia. Tessalon for cough. She could try Flonase for the postnasal drip that she describes. She'll monitor and if not improving she'll contact us later this week for follow-up. She is given return precautions.   No orders of the defined types were placed in this encounter.   Meds ordered this encounter  Medications  . azithromycin (ZITHROMAX) 250 MG tablet    Sig: Take 500 mg (2 tablets) by mouth today, then take 250 mg (one tablet) by mouth daily    Dispense:  6 tablet    Refill:  0  . benzonatate (TESSALON) 200 MG capsule    Sig: Take 1 capsule (200 mg total) by mouth 2 (two) times daily as needed for cough.    Dispense:  20 capsule    Refill:  0    Tommi Rumps, MD [place]

## 2016-10-05 NOTE — Progress Notes (Signed)
Pre visit review using our clinic review tool, if applicable. No additional management support is needed unless otherwise documented below in the visit note. 

## 2016-10-05 NOTE — Patient Instructions (Signed)
Nice to see you. You likely have bronchitis. We'll treat you with azithromycin and Tessalon. If you are not improving over the next several days please follow-up with [place]. If you develop cough productive of blood, shortness of breath, persistent fevers, or any new or changing symptoms please seek medical attention immediately.

## 2016-10-05 NOTE — Assessment & Plan Note (Signed)
Doing significantly better. No longer on any medication for this. She'll continue to monitor.

## 2016-10-05 NOTE — Assessment & Plan Note (Signed)
Symptoms most consistent with bronchitis. Given duration and persistent intermittent fevers we will treat with azithromycin. No focal findings on lung exam to indicate pneumonia. Tessalon for cough. She could try Flonase for the postnasal drip that she describes. She'll monitor and if not improving she'll contact us later this week for follow-up. She is given return precautions.

## 2016-12-31 ENCOUNTER — Encounter: Payer: Self-pay | Admitting: Family Medicine

## 2017-01-01 ENCOUNTER — Encounter: Payer: Self-pay | Admitting: Family Medicine

## 2017-01-01 ENCOUNTER — Ambulatory Visit (INDEPENDENT_AMBULATORY_CARE_PROVIDER_SITE_OTHER): Payer: Managed Care, Other (non HMO) | Admitting: Family Medicine

## 2017-01-01 VITALS — BP 110/60 | HR 62 | Temp 98.5°F | Ht 64.0 in | Wt 125.8 lb

## 2017-01-01 DIAGNOSIS — Z0000 Encounter for general adult medical examination without abnormal findings: Secondary | ICD-10-CM | POA: Diagnosis not present

## 2017-01-01 DIAGNOSIS — Z130 Encounter for screening for diseases of the blood and blood-forming organs and certain disorders involving the immune mechanism: Secondary | ICD-10-CM | POA: Diagnosis not present

## 2017-01-01 DIAGNOSIS — Z13220 Encounter for screening for lipoid disorders: Secondary | ICD-10-CM | POA: Diagnosis not present

## 2017-01-01 DIAGNOSIS — Z1329 Encounter for screening for other suspected endocrine disorder: Secondary | ICD-10-CM

## 2017-01-01 LAB — COMPREHENSIVE METABOLIC PANEL
ALK PHOS: 45 U/L (ref 39–117)
ALT: 17 U/L (ref 0–35)
AST: 18 U/L (ref 0–37)
Albumin: 4.7 g/dL (ref 3.5–5.2)
BUN: 13 mg/dL (ref 6–23)
CALCIUM: 9.7 mg/dL (ref 8.4–10.5)
CO2: 29 meq/L (ref 19–32)
Chloride: 104 mEq/L (ref 96–112)
Creatinine, Ser: 0.79 mg/dL (ref 0.40–1.20)
GFR: 86.09 mL/min (ref >60.00)
GLUCOSE: 99 mg/dL (ref 70–99)
POTASSIUM: 4.1 meq/L (ref 3.5–5.1)
Sodium: 139 mEq/L (ref 135–145)
Total Bilirubin: 0.9 mg/dL (ref 0.2–1.2)
Total Protein: 7.2 g/dL (ref 6.0–8.3)

## 2017-01-01 LAB — CBC WITH DIFFERENTIAL/PLATELET
BASOS ABS: 0.1 10*3/uL (ref 0.0–0.1)
BASOS PCT: 1 % (ref 0.0–3.0)
Eosinophils Absolute: 0.1 10*3/uL (ref 0.0–0.7)
Eosinophils Relative: 1.2 % (ref 0.0–5.0)
HEMATOCRIT: 42.5 % (ref 36.0–46.0)
Hemoglobin: 14.2 g/dL (ref 12.0–15.0)
LYMPHS ABS: 1.7 10*3/uL (ref 0.7–4.0)
LYMPHS PCT: 32 % (ref 12.0–46.0)
MCHC: 33.4 g/dL (ref 30.0–36.0)
MCV: 95.9 fl (ref 78.0–100.0)
MONOS PCT: 9.7 % (ref 3.0–12.0)
Monocytes Absolute: 0.5 10*3/uL (ref 0.1–1.0)
NEUTROS ABS: 3 10*3/uL (ref 1.4–7.7)
NEUTROS PCT: 56.1 % (ref 43.0–77.0)
PLATELETS: 357 10*3/uL (ref 150.0–400.0)
RBC: 4.43 Mil/uL (ref 3.87–5.11)
RDW: 12.9 % (ref 11.5–15.5)
WBC: 5.4 10*3/uL (ref 4.0–10.5)

## 2017-01-01 LAB — LIPID PANEL
CHOL/HDL RATIO: 2
Cholesterol: 188 mg/dL (ref 0–200)
HDL: 80.4 mg/dL (ref >39.00)
LDL Cholesterol: 98 mg/dL (ref 0–99)
NONHDL: 107.77
TRIGLYCERIDES: 48 mg/dL (ref 0.0–149.0)
VLDL: 9.6 mg/dL (ref 0.0–40.0)

## 2017-01-01 LAB — TSH: TSH: 2.56 u[IU]/mL (ref 0.35–4.50)
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
## 2017-01-01 NOTE — Patient Instructions (Signed)
Nice to see you. We are going to obtain lab work and contact you with the results. Please continue to stay active and watch your diet. We'll plan on getting a mammogram next year. Please check with your father to see if he had colon cancer or colon polyps. This will help dictate when you get a colonoscopy.

## 2017-01-01 NOTE — Assessment & Plan Note (Signed)
Physical exam completed. Overall doing well. Encouraged continued dietary changes and staying active. Pap smear completed last year and reviewed. No need for Pap smear or pelvic exam this year. Breast exam completed and normal. I encouraged her to check with her father to see if he had colon cancer or a colon polyp. Advised that it would be unlikely for him to have colon cancer if they were not doing any treatment. Discussed that if he did have colon cancer she would need colon cancer screening starting at age 47. Discussed appropriate alcohol use for female. Encouraged her to limit her drinking to 1 glass of wine per night on average. Lab work is ordered below.

## 2017-01-04 ENCOUNTER — Telehealth: Payer: Self-pay | Admitting: Family Medicine

## 2017-01-04 ENCOUNTER — Telehealth: Payer: Self-pay | Admitting: *Deleted

## 2017-01-04 NOTE — Telephone Encounter (Signed)
Spoke with patient and reviewed lab results. thanks

## 2017-01-04 NOTE — Telephone Encounter (Signed)
See additional notes, gave results, thanks

## 2017-01-04 NOTE — Telephone Encounter (Signed)
Pt has requested lab results  Pt contact [phone]

## 2017-01-04 NOTE — Telephone Encounter (Signed)
Attempted to reach patient, left a VM. thanks 

## 2017-01-04 NOTE — Telephone Encounter (Signed)
Pt called back returning your call in regards to labs. Please advise, thank you!  Call pt @ [phone]

## 2017-10-18 ENCOUNTER — Telehealth: Payer: Self-pay | Admitting: *Deleted

## 2017-10-18 NOTE — Telephone Encounter (Signed)
Copied from [place]. Topic: Appointment Scheduling - Scheduling Inquiry for Clinic >> Oct 18, 2017  4:11 PM Bea Graff, NT wrote: Reason for CRM: Pt calling with possible strep throat and would like to be worked in for an appt tomorrow if at all possible. Please contact pt

## 2017-10-18 NOTE — Telephone Encounter (Signed)
Cold symptoms X 2 weeks very bad burning in throat has chills has 42 year old DX with Strep. Advised patient she needs to be evaluated at [facility] or [facility] patient stated she really wants to come here to see PCP if she does not hear from office by 9 am  tomorrow she will go to [facility].

## 2017-10-19 NOTE — Telephone Encounter (Signed)
Patient states she went to [facility] walk in and she was diagnosed with strep and upper respiratory infection

## 2017-10-19 NOTE — Telephone Encounter (Signed)
Left message to return call 

## 2017-10-19 NOTE — Telephone Encounter (Signed)
Noted  

## 2017-10-19 NOTE — Telephone Encounter (Signed)
Ok to schedule at 11:15.

## 2017-10-19 NOTE — Telephone Encounter (Signed)
Please advise 

## 2017-10-19 NOTE — Telephone Encounter (Signed)
See prior note

## 2018-05-10 ENCOUNTER — Other Ambulatory Visit: Payer: Self-pay | Admitting: Family Medicine

## 2018-05-10 DIAGNOSIS — Z1231 Encounter for screening mammogram for malignant neoplasm of breast: Secondary | ICD-10-CM

## 2018-05-24 ENCOUNTER — Ambulatory Visit
Admission: RE | Admit: 2018-05-24 | Discharge: 2018-05-24 | Disposition: A | Discharge status: Home / Self Care | Payer: Managed Care, Other (non HMO) | Source: Ambulatory Visit | Attending: Family Medicine | Admitting: Family Medicine

## 2018-05-24 DIAGNOSIS — Z1231 Encounter for screening mammogram for malignant neoplasm of breast: Secondary | ICD-10-CM | POA: Diagnosis present

## 2018-08-22 ENCOUNTER — Ambulatory Visit: Payer: Managed Care, Other (non HMO) | Admitting: Internal Medicine

## 2018-08-22 ENCOUNTER — Encounter: Payer: Self-pay | Admitting: Internal Medicine

## 2018-08-22 VITALS — BP 98/62 | HR 64 | Temp 98.1°F | Resp 14 | Ht 64.0 in | Wt 139.4 lb

## 2018-08-22 DIAGNOSIS — R05 Cough: Secondary | ICD-10-CM

## 2018-08-22 DIAGNOSIS — J209 Acute bronchitis, unspecified: Secondary | ICD-10-CM | POA: Diagnosis not present

## 2018-08-22 DIAGNOSIS — R059 Cough, unspecified: Secondary | ICD-10-CM

## 2018-08-22 DIAGNOSIS — J029 Acute pharyngitis, unspecified: Secondary | ICD-10-CM

## 2018-08-22 LAB — POCT RAPID STREP A (OFFICE): RAPID STREP A SCREEN: NEGATIVE

## 2018-08-22 LAB — POCT INFLUENZA A/B
INFLUENZA A, POC: NEGATIVE
Influenza B, POC: NEGATIVE

## 2018-08-22 MED ORDER — PREDNISONE 10 MG PO TABS: ORAL_TABLET | ORAL | 0 refills | Status: DC

## 2018-08-22 MED ORDER — AZITHROMYCIN 250 MG PO TABS: ORAL_TABLET | ORAL | 0 refills | Status: DC

## 2018-08-22 MED ORDER — GUAIFENESIN-CODEINE 100-10 MG/5ML PO SYRP: 10.0000 mL | ORAL_SOLUTION | Freq: Every evening | ORAL | 0 refills | Status: DC | PRN

## 2018-08-22 NOTE — Progress Notes (Signed)
Subjective:  Patient ID: Lynn Nelson, female    DOB: 05-Jul-1976  Age: 43 y.o. MRN: [id number]  CC: The primary encounter diagnosis was Cough. Diagnoses of Sore throat and Acute tracheobronchitis were also pertinent to this visit.  HPI Lynn Nelson presents for cough that has been present for the past 3 weeks.  She is a widowed mother of 2 young children who have both been diagnosed and treated for bacterial sinusitis recently by their pediatrician.    Over the last several days she has developed  Sore throat,  fevers and body aches . Chest now feels heavy, and she has a persistent cough that is keeping her up at night.  She has chest pain with cough.    She denies any significant sinus pain or congestion.  Her cough is productive of  green mucus.    No outpatient medications prior to visit.   No facility-administered medications prior to visit.     Review of Systems;  Patient denies , unintentional weight loss, skin rash, eye pain, sinus congestion and sinus pain,, dysphagia,  hemoptysis ,  dyspnea, wheezing,  palpitations, orthopnea, edema, abdominal pain, nausea, melena, diarrhea, constipation, flank pain, dysuria, hematuria, urinary  Frequency, nocturia, numbness, tingling, seizures,  Focal weakness, Loss of consciousness,  Tremor, insomnia, depression, anxiety, and suicidal ideation.      Objective:  BP 98/62 (BP Location: Left Arm, Patient Position: Sitting, Cuff Size: Normal)   Pulse 64   Temp 98.1 F (36.7 C) (Oral)   Resp 14   Ht 5\' 4"  (1.626 m)   Wt 139 lb 6.4 oz (63.2 kg)   SpO2 100%   BMI 23.93 kg/m   BP Readings from Last 3 Encounters:  08/22/18 98/62  01/01/17 110/60  10/05/16 106/82    Wt Readings from Last 3 Encounters:  08/22/18 139 lb 6.4 oz (63.2 kg)  01/01/17 125 lb 12.8 oz (57.1 kg)  10/05/16 128 lb 3.2 oz (58.2 kg)    General appearance: alert, cooperative and appears stated age Ears: normal TM's and external ear canals both ears Face: no  sinus tenderness Throat: lips, mucosa, and tongue normal; teeth and gums normal Neck: no adenopathy, no carotid bruit, supple, symmetrical, trachea midline and thyroid not enlarged, symmetric, no tenderness/mass/nodules Back: symmetric, no curvature. ROM normal. No CVA tenderness. Lungs: clear to auscultation bilaterally.  No ronchi, wheezing or egophony  Heart: regular rate and rhythm, S1, S2 normal, no murmur, click, rub or gallop Abdomen: soft, non-tender; bowel sounds normal; no masses,  no organomegaly Pulses: 2+ and symmetric Skin: Skin color, texture, turgor normal. No rashes or lesions Lymph nodes: Tender preauricular and Cervical lymphadenopathy  Lab Results  Component Value Date   HGBA1C 5.2 11/14/2015    Lab Results  Component Value Date   CREATININE 0.79 01/01/2017   CREATININE 0.74 11/14/2015   CREATININE 0.68 11/11/2011    Lab Results  Component Value Date   WBC 5.4 01/01/2017   HGB 14.2 01/01/2017   HCT 42.5 01/01/2017   PLT 357.0 01/01/2017   GLUCOSE 99 01/01/2017   CHOL 188 01/01/2017   TRIG 48.0 01/01/2017   HDL 80.40 01/01/2017   LDLCALC 98 01/01/2017   ALT 17 01/01/2017   AST 18 01/01/2017   NA 139 01/01/2017   K 4.1 01/01/2017   CL 104 01/01/2017   CREATININE 0.79 01/01/2017   BUN 13 01/01/2017   CO2 29 01/01/2017   TSH 2.56 01/01/2017   HGBA1C 5.2 11/14/2015  Assessment & Plan:   Problem List Items Addressed This Visit    Acute tracheobronchitis    Strep test and influena test were both negative,  Empiric azithromycin,  Steroid taper and cough suppressant.  Probiotic use advise for 3 weeks       Other Visit Diagnoses    Cough    -  Primary   Relevant Orders   POCT Influenza A/B (Completed)   POCT rapid strep A (Completed)   DG Chest 2 View   Sore throat       Relevant Orders   POCT Influenza A/B (Completed)   POCT rapid strep A (Completed)      I am having Lynn Nelson start on azithromycin, predniSONE, and  guaiFENesin-codeine.  Meds ordered this encounter  Medications  . azithromycin (ZITHROMAX) 250 MG tablet    Sig: 2 tablets one day 1,  One tablet daily until gone    Dispense:  6 tablet    Refill:  0  . predniSONE (DELTASONE) 10 MG tablet    Sig: 6 tablets daily for 3 days,  then reduce by 1 tablet daily until gone    Dispense:  21 tablet    Refill:  0  . guaiFENesin-codeine (CHERATUSSIN AC) 100-10 MG/5ML syrup    Sig: Take 10 mLs by mouth at bedtime as needed for cough.    Dispense:  120 mL    Refill:  0    There are no discontinued medications.  Follow-up: No follow-ups on file.   Crecencio Mc, MD

## 2018-08-22 NOTE — Patient Instructions (Signed)
You have a viral infection complicated by bacterial infection    .  I am prescribing an antibiotic (azithromycin)  and the prednisone taper  To resolve the inflammation    Use cheratussin at night for the cough and OTC  Delsym for daily cough  Gargle with salt water as needed for sore throat.   If the cheratussin is too strong,  You can use Benadryl and Robitussin at bedtime instead   Taking an antibiotic can create an imbalance in the normal population of bacteria that live in the small intestine.  This imbalance can persist for 3 months.   Taking a probiotic ( Align, Floraque or Culturelle), the generic version of one of these over the counter medications, or an alternative form (kombucha,  Yogurt, or another dietary source) for a minimum of 3 weeks may help prevent a serious antibiotic associated diarrhea  Called clostridium dificile colitis that occurs when the bacteria population is altered .  Taking a probiotic may also prevent vaginitis due to yeast infections and can be continued indefinitely if you feel that it improves your digestion or your elimination (bowels).

## 2018-08-23 ENCOUNTER — Encounter: Payer: Self-pay | Admitting: Internal Medicine

## 2018-08-23 DIAGNOSIS — J209 Acute bronchitis, unspecified: Secondary | ICD-10-CM | POA: Insufficient documentation

## 2018-08-23 NOTE — Assessment & Plan Note (Signed)
Strep test and influena test were both negative,  Empiric azithromycin,  Steroid taper and cough suppressant.  Probiotic use advise for 3 weeks

## 2018-08-25 ENCOUNTER — Telehealth: Payer: Self-pay | Admitting: Family Medicine

## 2018-08-25 NOTE — Telephone Encounter (Signed)
Last seen by T[place]. Sent to [place] to advise.

## 2018-08-25 NOTE — Telephone Encounter (Signed)
Does pt still needs to have the chest xray done? Pt states she was put on Prednisone and Antibiotic. Please advise? Thank you!

## 2018-08-25 NOTE — Telephone Encounter (Signed)
If she is improving,  She does not need the x ray

## 2018-08-26 NOTE — Telephone Encounter (Signed)
Called patient and left a detailed VM. I told pt that Dr. Derrel Nip stated that if she feels that her symptoms are improving NO x-ray is needed however, if NOT we would like for her yo call us back to inform us of her symptoms. Left my name and call back number. CRM created and sent to [facility] pool.

## 2018-08-30 NOTE — Telephone Encounter (Signed)
Thank you noting scheduled order.

## 2018-09-01 NOTE — Telephone Encounter (Signed)
Sent to Jessica 

## 2018-10-24 ENCOUNTER — Ambulatory Visit: Payer: Managed Care, Other (non HMO) | Admitting: Family

## 2018-10-24 ENCOUNTER — Encounter: Payer: Self-pay | Admitting: Family

## 2018-10-24 VITALS — BP 92/58 | HR 77 | Temp 98.2°F | Wt 141.2 lb

## 2018-10-24 DIAGNOSIS — J40 Bronchitis, not specified as acute or chronic: Secondary | ICD-10-CM

## 2018-10-24 MED ORDER — DOXYCYCLINE HYCLATE 100 MG PO TABS: 100.0000 mg | ORAL_TABLET | Freq: Two times a day (BID) | ORAL | 0 refills | Status: DC

## 2018-10-24 MED ORDER — ALBUTEROL SULFATE HFA 108 (90 BASE) MCG/ACT IN AERS: 2.0000 | INHALATION_SPRAY | Freq: Four times a day (QID) | RESPIRATORY_TRACT | 0 refills | Status: DC | PRN

## 2018-10-24 MED ORDER — BENZONATATE 100 MG PO CAPS: 100.0000 mg | ORAL_CAPSULE | Freq: Two times a day (BID) | ORAL | 0 refills | Status: DC | PRN

## 2018-10-24 NOTE — Progress Notes (Signed)
Subjective:    Patient ID: Lynn Nelson, female    DOB: [birth year], 43 y.o.   MRN: [id number]  CC: Lynn Nelson is a 43 y.o. female who presents today for an acute visit.    HPI: Chief complaint dry cough or 2 to 3 weeks, unchanged. 'tickle'   Cough is worse at night.  Started with sneezing, runny nose, fatigue. At  Home 101 tmax off and on for a week, last fever was one week ago. No myalgias, ear pain, sinus pain, sob, wheezing   Tried cold and flu medication, antihistamine OTC without with relief.    No travel.    Non smoker.    Seen 08/22/18 for cough; given zpak, prednisone, codeine based syrup with complete resolution  HISTORY:  Past Medical History:  Diagnosis Date  . Chicken pox   . Depression    Past Surgical History:  Procedure Laterality Date  . AUGMENTATION MAMMAPLASTY Bilateral   . breast agumentation      BL  . BREAST SURGERY  2004   augmentation 2004  . DILATION AND CURETTAGE OF UTERUS     x2  . EYE SURGERY     corrective in 1986  . TUBAL LIGATION     Family History  Problem Relation Age of Onset  . Cancer Mother        breast cancer 5 years ago  . Breast cancer Mother 56  . Heart disease Father   . Stroke Maternal Grandmother   . Diabetes Maternal Grandmother   . Cancer Paternal Grandmother   . Alcohol abuse Paternal Grandmother   . Cancer Paternal Grandfather   . Alcohol abuse Paternal Grandfather     Allergies: Penicillins Current Outpatient Medications on File Prior to Visit  Medication Sig Dispense Refill  . guaiFENesin-codeine (CHERATUSSIN AC) 100-10 MG/5ML syrup Take 10 mLs by mouth at bedtime as needed for cough. 120 mL 0   No current facility-administered medications on file prior to visit.     Social History   Tobacco Use  . Smoking status: Never Smoker  . Smokeless tobacco: Never Used  Substance Use Topics  . Alcohol use: Yes    Alcohol/week: 3.0 standard drinks    Types: 3 Glasses of wine per week  . Drug use:  No    Review of Systems  Constitutional: Positive for fatigue and fever. Negative for chills.  HENT: Positive for postnasal drip and rhinorrhea. Negative for congestion, sinus pain and sore throat.   Respiratory: Positive for cough. Negative for shortness of breath and wheezing.   Cardiovascular: Negative for chest pain and palpitations.  Gastrointestinal: Negative for nausea and vomiting.      Objective:    BP (!) 92/58 (BP Location: Left Arm, Patient Position: Sitting, Cuff Size: Large)   Pulse 77   Temp 98.2 F (36.8 C)   Wt 141 lb 3.2 oz (64 kg)   SpO2 98%   BMI 24.24 kg/m    Physical Exam Vitals signs reviewed.  Constitutional:      Appearance: She is well-developed.  HENT:     Head: Normocephalic and atraumatic.     Right Ear: Hearing, tympanic membrane, ear canal and external ear normal. No decreased hearing noted. No drainage, swelling or tenderness. No middle ear effusion. No foreign body. Tympanic membrane is not erythematous or bulging.     Left Ear: Hearing, tympanic membrane, ear canal and external ear normal. No decreased hearing noted. No drainage, swelling or tenderness.  No middle  ear effusion. No foreign body. Tympanic membrane is not erythematous or bulging.     Nose: Nose normal. No rhinorrhea.     Right Sinus: No maxillary sinus tenderness or frontal sinus tenderness.     Left Sinus: No maxillary sinus tenderness or frontal sinus tenderness.     Mouth/Throat:     Pharynx: Uvula midline. No oropharyngeal exudate or posterior oropharyngeal erythema.     Tonsils: No tonsillar abscesses.  Eyes:     Conjunctiva/sclera: Conjunctivae normal.  Cardiovascular:     Rate and Rhythm: Regular rhythm.     Pulses: Normal pulses.     Heart sounds: Normal heart sounds.  Pulmonary:     Effort: Pulmonary effort is normal.     Breath sounds: Normal breath sounds. No wheezing, rhonchi or rales.  Lymphadenopathy:     Head:     Right side of head: No submental,  submandibular, tonsillar, preauricular, posterior auricular or occipital adenopathy.     Left side of head: No submental, submandibular, tonsillar, preauricular, posterior auricular or occipital adenopathy.     Cervical: No cervical adenopathy.  Skin:    General: Skin is warm and dry.  Neurological:     Mental Status: She is alert.  Psychiatric:        Speech: Speech normal.        Behavior: Behavior normal.        Thought Content: Thought content normal.        Assessment & Plan:   1. Bronchitis No acute respiratory distress.  Patient is afebrile.  Suspect viral versus bacterial etiology.  Based on duration of symptoms, agreed to treat with antibiotic therapy.  Also trial albuterol to see if this relieves cough symptom.  Advised to use Tessalon Perles sparingly and to use honey, tea for cough during the daytime.  She will let me know if not better  - doxycycline (VIBRA-TABS) 100 MG tablet; Take 1 tablet (100 mg total) by mouth 2 (two) times daily.  Dispense: 10 tablet; Refill: 0 - benzonatate (TESSALON) 100 MG capsule; Take 1 capsule (100 mg total) by mouth 2 (two) times daily as needed for cough.  Dispense: 20 capsule; Refill: 0 - albuterol (PROVENTIL HFA;VENTOLIN HFA) 108 (90 Base) MCG/ACT inhaler; Inhale 2 puffs into the lungs every 6 (six) hours as needed for wheezing or shortness of breath.  Dispense: 1 Inhaler; Refill: 0    I have discontinued Edmonia Lynch. Rakes's azithromycin and predniSONE. I am also having her start on doxycycline, benzonatate, and albuterol. Additionally, I am having her maintain her guaiFENesin-codeine.   Meds ordered this encounter  Medications  . doxycycline (VIBRA-TABS) 100 MG tablet    Sig: Take 1 tablet (100 mg total) by mouth 2 (two) times daily.    Dispense:  10 tablet    Refill:  0    Order Specific Question:   Supervising Provider    Answer:   Deborra Medina L [[id number]]  . benzonatate (TESSALON) 100 MG capsule    Sig: Take 1 capsule (100 mg  total) by mouth 2 (two) times daily as needed for cough.    Dispense:  20 capsule    Refill:  0    Order Specific Question:   Supervising Provider    Answer:   Deborra Medina L [[id number]]  . albuterol (PROVENTIL HFA;VENTOLIN HFA) 108 (90 Base) MCG/ACT inhaler    Sig: Inhale 2 puffs into the lungs every 6 (six) hours as needed for wheezing or shortness of breath.  Dispense:  1 Inhaler    Refill:  0    Order Specific Question:   Supervising Provider    Answer:   Crecencio Mc [[id number]]    Return precautions given.   Risks, benefits, and alternatives of the medications and treatment plan prescribed today were discussed, and patient expressed understanding.   Education regarding symptom management and diagnosis given to patient on AVS.  Continue to follow with Leone Haven, MD for routine health maintenance.   Lynn Nelson and I agreed with plan.   Mable Paris, FNP

## 2018-10-24 NOTE — Patient Instructions (Signed)
Start doxycycline as discussed today.  Please  avoid extended exposure to the sun as this medication can make you more likely to sunburn. Probiotics as discussed Tessalon Perles Use albuterol every 6 hours for first 24 hours to get good medication into the lungs and loosen congestion; after, you may use as needed and eventually stop all together when cough resolves.   Please let us know if not better  Nice to meet you!

## 2018-11-14 ENCOUNTER — Other Ambulatory Visit: Payer: Self-pay | Admitting: Family

## 2018-11-14 DIAGNOSIS — J40 Bronchitis, not specified as acute or chronic: Secondary | ICD-10-CM

## 2019-02-28 ENCOUNTER — Ambulatory Visit: Payer: Self-pay | Admitting: *Deleted

## 2019-02-28 ENCOUNTER — Other Ambulatory Visit: Payer: Self-pay

## 2019-02-28 ENCOUNTER — Ambulatory Visit (INDEPENDENT_AMBULATORY_CARE_PROVIDER_SITE_OTHER): Payer: Managed Care, Other (non HMO) | Admitting: Family Medicine

## 2019-02-28 DIAGNOSIS — Z20822 Contact with and (suspected) exposure to covid-19: Secondary | ICD-10-CM

## 2019-02-28 DIAGNOSIS — J029 Acute pharyngitis, unspecified: Secondary | ICD-10-CM

## 2019-02-28 DIAGNOSIS — R05 Cough: Secondary | ICD-10-CM

## 2019-02-28 DIAGNOSIS — R059 Cough, unspecified: Secondary | ICD-10-CM

## 2019-02-28 DIAGNOSIS — R481 Agnosia: Secondary | ICD-10-CM

## 2019-02-28 DIAGNOSIS — R5383 Other fatigue: Secondary | ICD-10-CM | POA: Diagnosis not present

## 2019-02-28 DIAGNOSIS — R6889 Other general symptoms and signs: Secondary | ICD-10-CM | POA: Diagnosis not present

## 2019-02-28 DIAGNOSIS — R0981 Nasal congestion: Secondary | ICD-10-CM | POA: Diagnosis not present

## 2019-02-28 DIAGNOSIS — R197 Diarrhea, unspecified: Secondary | ICD-10-CM

## 2019-02-28 DIAGNOSIS — Z20828 Contact with and (suspected) exposure to other viral communicable diseases: Secondary | ICD-10-CM

## 2019-02-28 NOTE — Telephone Encounter (Signed)
Lynn Nelson  28-Oct-1975  [id number]  [place], [id number]  Needs covid test: fatigue, loss of taste, congestion, diarrhea, headache, neck pain. Patient reports she was at beach last week.

## 2019-02-28 NOTE — Telephone Encounter (Signed)
Called and spoke with pt, she has been scheduled for today at the [facility] testing site for 10:30. Pt is aware to remain in her car and wear a mask. Pt understood and had no additional questions at this time. Nothing further is needed  Order was placed

## 2019-02-28 NOTE — Telephone Encounter (Signed)
Patient was scheduled a virtual visit with Philis Nettle, FNP today at 9:20 am.

## 2019-02-28 NOTE — Telephone Encounter (Signed)
Patient is calling to report fatigue, loss of taste, congestion, diarrhea, headache, neck pain. Patient reports she was at beach last week.  Call to office for appointment.  Reason for Disposition . [1] COVID-19 infection suspected by caller or triager AND [2] mild symptoms (cough, fever, or others) AND [7] no complications or SOB  Answer Assessment - Initial Assessment Questions 1. COVID-19 DIAGNOSIS: "Who made your Coronavirus (COVID-19) diagnosis?" "Was it confirmed by a positive lab test?" If not diagnosed by a HCP, ask "Are there lots of cases (community spread) where you live?" (See public health department website, if unsure)     Patient is symptomatic 2. ONSET: "When did the COVID-19 symptoms start?"      This week- although patient has had the fatigue for some time 3. WORST SYMPTOM: "What is your worst symptom?" (e.g., cough, fever, shortness of breath, muscle aches)     Neck pain, fatigue- all around not feeling good 4. COUGH: "Do you have a cough?" If so, ask: "How bad is the cough?"       Yes- dry and congestion related- started yesterday 5. FEVER: "Do you have a fever?" If so, ask: "What is your temperature, how was it measured, and when did it start?"     No fever 6. RESPIRATORY STATUS: "Describe your breathing?" (e.g., shortness of breath, wheezing, unable to speak)      Congestion only 7. BETTER-SAME-WORSE: "Are you getting better, staying the same or getting worse compared to yesterday?"  If getting worse, ask, "In what way?"     Worse- increasing symptoms 8. HIGH RISK DISEASE: "Do you have any chronic medical problems?" (e.g., asthma, heart or lung disease, weak immune system, etc.)     no 9. PREGNANCY: "Is there any chance you are pregnant?" "When was your last menstrual period?"     No- BTL, LMP- started 2 days ago 10. OTHER SYMPTOMS: "Do you have any other symptoms?"  (e.g., chills, fatigue, headache, loss of smell or taste, muscle pain, sore throat)  Fatigue,headache,loss of taste, muscle pain, diarrhea- off/on  Protocols used: CORONAVIRUS (COVID-19) DIAGNOSED OR SUSPECTED-A-AH

## 2019-02-28 NOTE — Progress Notes (Signed)
Patient ID: Lynn Nelson, female   DOB: 04-Jul-1976, 43 y.o.   MRN: [id number]    Virtual Visit via video Note  This visit type was conducted due to national recommendations for restrictions regarding the COVID-19 pandemic (e.g. social distancing).  This format is felt to be most appropriate for this patient at this time.  All issues noted in this document were discussed and addressed.  No physical exam was performed (except for noted visual exam findings with Video Visits).   I connected with Jeffie Pollock today at  9:20 AM EDT by a video enabled telemedicine application verified that I am speaking with the correct person using two identifiers. Location patient: home Location provider: work or home office Persons participating in the virtual visit: patient, provider  I discussed the limitations, risks, security and privacy concerns of performing an evaluation and management service by video and the availability of in person appointments. I also discussed with the patient that there may be a patient responsible charge related to this service. The patient expressed understanding and agreed to proceed.   HPI:  Patient and I connected via video due to complaint of fatigue, sore throat, cough, congestion, diarrhea, loss of taste and general overall feeling unwell.  Patient also states she did travel to the beach in [place] last weekend and is now concerned she possibly could have COVID-19.  Has not had any fever, last temperature was 97.7 F.  Denies vomiting.  Denies shortness of breath or wheezing.  Denies urinary issues.  Has been trying to keep self well-hydrated with water and Gatorade.  Has been taking Tylenol for aches with decent effect.    ROS: See pertinent positives and negatives per HPI.  Past Medical History:  Diagnosis Date   Chicken pox    Depression     Past Surgical History:  Procedure Laterality Date   AUGMENTATION MAMMAPLASTY Bilateral    breast  agumentation      BL   BREAST SURGERY  2004   augmentation 2004   DILATION AND CURETTAGE OF UTERUS     x2   EYE SURGERY     corrective in 1986   TUBAL LIGATION      Family History  Problem Relation Age of Onset   Cancer Mother        breast cancer 5 years ago   Breast cancer Mother 11   Heart disease Father    Stroke Maternal Grandmother    Diabetes Maternal Grandmother    Cancer Paternal Grandmother    Alcohol abuse Paternal Grandmother    Cancer Paternal Grandfather    Alcohol abuse Paternal Grandfather     Social History   Tobacco Use   Smoking status: Never Smoker   Smokeless tobacco: Never Used  Substance Use Topics   Alcohol use: Yes    Alcohol/week: 3.0 standard drinks    Types: 3 Glasses of wine per week    Current Outpatient Medications:    albuterol (PROVENTIL HFA;VENTOLIN HFA) 108 (90 Base) MCG/ACT inhaler, TAKE 2 PUFFS BY MOUTH EVERY 6 HOURS AS NEEDED FOR WHEEZE OR SHORTNESS OF BREATH, Disp: 8.5 Inhaler, Rfl: 0   benzonatate (TESSALON) 100 MG capsule, Take 1 capsule (100 mg total) by mouth 2 (two) times daily as needed for cough., Disp: 20 capsule, Rfl: 0   doxycycline (VIBRA-TABS) 100 MG tablet, Take 1 tablet (100 mg total) by mouth 2 (two) times daily., Disp: 10 tablet, Rfl: 0   guaiFENesin-codeine (CHERATUSSIN AC) 100-10 MG/5ML syrup, Take  10 mLs by mouth at bedtime as needed for cough., Disp: 120 mL, Rfl: 0  EXAM:  GENERAL: alert, oriented, appears well and in no acute distress  HEENT: atraumatic, conjunttiva clear, no obvious abnormalities on inspection of external nose and ears  NECK: normal movements of the head and neck  LUNGS: on inspection no signs of respiratory distress, breathing rate appears normal, no obvious gross SOB, gasping or wheezing  CV: no obvious cyanosis  MS: moves all visible extremities without noticeable abnormality  PSYCH/NEURO: pleasant and cooperative, no obvious depression or anxiety, speech and  thought processing grossly intact  ASSESSMENT AND PLAN:  Discussed the following assessment and plan:  Suspected COVID-19 virus infection, sore throat, congestion, cough, fatigue, loss of taste, diarrhea - advised that due to symptoms, we need to get patient set up for COVID-19 testing.  Patient advised that a nurse will be calling her to give her time and location to drive up for drive-through testing.  Patient advised that testing is taking 2 to 7 days to result, and while we are awaiting results patient must remain under self quarantine and monitor for any changing/worsening symptoms.  Advised over-the-counter medications such as Tylenol can be used to help treat pain or fevers, Robitussin can be used to help calm cough, allergy medication such as Claritin or Allegra can help reduce congestion, OTC imodium or pepto bismol as needed for GI symptoms.  Also discussed getting plenty of rest and increasing fluid intake.  Made patient aware that test results as well as how his symptoms progress will determine when the self quarantine will be able to end.  Also advised to monitor self for any worsening symptoms, advised if severe shortness of breath develops, high fever that is not reduced with use of Tylenol, chest pain, severe vomiting or diarrhea  --patient must call on-call and or go to ER right away for evaluation. patient verbalized understanding of these instructions.    I discussed the assessment and treatment plan with the patient. The patient was provided an opportunity to ask questions and all were answered. The patient agreed with the plan and demonstrated an understanding of the instructions.   The patient was advised to call back or seek an in-person evaluation if the symptoms worsen or if the condition fails to improve as anticipated.  I provided 15 minutes of video face-to-face time during this encounter.   Lynn Green, FNP

## 2019-03-04 LAB — NOVEL CORONAVIRUS, NAA: SARS-CoV-2, NAA: NOT DETECTED

## 2019-05-31 ENCOUNTER — Other Ambulatory Visit: Payer: Self-pay | Admitting: Family Medicine

## 2019-06-05 ENCOUNTER — Telehealth: Payer: Self-pay

## 2019-06-05 DIAGNOSIS — Z1231 Encounter for screening mammogram for malignant neoplasm of breast: Secondary | ICD-10-CM

## 2019-06-05 NOTE — Telephone Encounter (Signed)
Copied from [place] [id number]. Topic: Referral - Request for Referral >> Jun 05, 2019 10:27 AM Yvette Rack wrote: Has patient seen PCP for this complaint? yes  *If NO, is insurance requiring patient see PCP for this issue before PCP can refer them? Referral for which specialty: mammogram Preferred provider/office: [facility] Reason for referral: Pt requests referral for mammogram

## 2019-06-07 NOTE — Addendum Note (Signed)
Addended by: Leone Haven on: 06/07/2019 05:42 PM   Modules accepted: Orders

## 2019-06-07 NOTE — Telephone Encounter (Signed)
Patient has not been seen for a physical in >2 years. I have ordered the mammogram though she should schedule a physical as well. She will have to call to schedule the mammogram. Thanks.

## 2019-06-08 NOTE — Telephone Encounter (Signed)
Patient is scheduled for physical 12/23 at 8am. She was informed that she could call [place] to schedule mammogram.

## 2019-06-15 ENCOUNTER — Encounter: Payer: Self-pay | Admitting: Family Medicine

## 2019-08-09 ENCOUNTER — Ambulatory Visit (INDEPENDENT_AMBULATORY_CARE_PROVIDER_SITE_OTHER): Payer: Medicaid Other | Admitting: Family Medicine

## 2019-08-09 ENCOUNTER — Encounter: Payer: Self-pay | Admitting: Family Medicine

## 2019-08-09 ENCOUNTER — Other Ambulatory Visit: Payer: Self-pay

## 2019-08-09 VITALS — Ht 64.0 in | Wt 150.0 lb

## 2019-08-09 DIAGNOSIS — F319 Bipolar disorder, unspecified: Secondary | ICD-10-CM

## 2019-08-09 DIAGNOSIS — Z0001 Encounter for general adult medical examination with abnormal findings: Secondary | ICD-10-CM

## 2019-08-09 DIAGNOSIS — R05 Cough: Secondary | ICD-10-CM

## 2019-08-09 DIAGNOSIS — R059 Cough, unspecified: Secondary | ICD-10-CM

## 2019-08-09 NOTE — Progress Notes (Signed)
Virtual Visit via video Note  This visit type was conducted due to national recommendations for restrictions regarding the COVID-19 pandemic (e.g. social distancing).  This format is felt to be most appropriate for this patient at this time.  All issues noted in this document were discussed and addressed.  No physical exam was performed (except for noted visual exam findings with Video Visits).   I connected with Lynn Nelson today at  8:00 AM EST by a video enabled telemedicine application and verified that I am speaking with the correct person using two identifiers. Location patient: home Location provider: work  Persons participating in the virtual visit: patient, provider  I discussed the limitations, risks, security and privacy concerns of performing an evaluation and management service by telephone and the availability of in person appointments. I also discussed with the patient that there may be a patient responsible charge related to this service. The patient expressed understanding and agreed to proceed.   Reason for visit: CPE  HPI: Exercise: She reports no specific exercise though she does move around a lot.   Diet is described as healthy.  She eats some junk food though not much.  No soda or sweet tea. Pap smear is due. Mammogram is scheduled. Has a family history of breast cancer in her mother.  No ovarian cancer or colon cancer in her family. Flu vaccine and tetanus vaccine up-to-date. HIV screening up-to-date. No tobacco use or illicit drug use.  She drinks 2 glasses of wine most nights of the week though not every night. Sees a dentist and an ophthalmologist.  Depression: Reports history of bipolar depression and PTSD.  She is not on medication currently.  She has had some feeling down and sad.  She has been more irritable.  She does have issues with insomnia.  She tried Wellbutrin previously and she had insomnia and headaches.  She was on Xanax previously though she  found she was taking more than she needed and does not want anything like that.  She has been overeating as well.  No SI.  Cough: This has been going on for 3 weeks.  She had a negative Covid test this past weekend.  She notes no fevers.  She had congestion at first though that has improved.  Some drainage.  The cough is progressively improving.  No shortness of breath.  No COVID-19 exposure.    ROS: See pertinent positives and negatives per HPI.  Past Medical History:  Diagnosis Date  . Chicken pox   . Depression     Past Surgical History:  Procedure Laterality Date  . AUGMENTATION MAMMAPLASTY Bilateral   . breast agumentation      BL  . BREAST SURGERY  2004   augmentation 2004  . DILATION AND CURETTAGE OF UTERUS     x2  . EYE SURGERY     corrective in 1986  . TUBAL LIGATION      Family History  Problem Relation Age of Onset  . Cancer Mother        breast cancer 5 years ago  . Breast cancer Mother 41  . Heart disease Father   . Stroke Maternal Grandmother   . Diabetes Maternal Grandmother   . Cancer Paternal Grandmother   . Alcohol abuse Paternal Grandmother   . Cancer Paternal Grandfather   . Alcohol abuse Paternal Grandfather     SOCIAL HX: non-smoker   Current Outpatient Medications:  .  albuterol (PROVENTIL HFA;VENTOLIN HFA) 108 (90 Base) MCG/ACT  inhaler, TAKE 2 PUFFS BY MOUTH EVERY 6 HOURS AS NEEDED FOR WHEEZE OR SHORTNESS OF BREATH, Disp: 8.5 Inhaler, Rfl: 0  EXAM:  VITALS per patient if applicable:  GENERAL: alert, oriented, appears well and in no acute distress  HEENT: atraumatic, conjunttiva clear, no obvious abnormalities on inspection of external nose and ears  NECK: normal movements of the head and neck  LUNGS: on inspection no signs of respiratory distress, breathing rate appears normal, no obvious gross SOB, gasping or wheezing  CV: no obvious cyanosis  MS: moves all visible extremities without noticeable abnormality  PSYCH/NEURO:  pleasant and cooperative, no obvious depression or anxiety, speech and thought processing grossly intact  ASSESSMENT AND PLAN:  Discussed the following assessment and plan:  Encounter for general adult medical examination with abnormal findings Physical exam completed.  Encouraged continued healthy diet and activity.  She is due for a Pap smear and we will have her come in to complete that.  She will keep her scheduled mammogram.  Discussed the importance of having yearly mammograms given her mom's history of breast cancer.  Discussed limiting alcohol intake to 7 beverages a week or less to no more than 3 beverages at a time.  Lab work to be completed when she comes in for her Pap smear.  Bipolar depression ([place]) Patient reports a history of bipolar disorder with depression.  Does feel depressed currently.  Discussed given the bipolar aspect it would be best to have her see a psychiatrist to discuss mood stabilizer versus antidepressant.  Referral will be placed.  Cough Suspect post viral cough.  She reports negative Covid testing.  Symptoms have been progressively improving.  Discussed that it could take up to a month for a cough to resolve.  If she continues to have cough after a month we will reevaluate.    I discussed the assessment and treatment plan with the patient. The patient was provided an opportunity to ask questions and all were answered. The patient agreed with the plan and demonstrated an understanding of the instructions.   The patient was advised to call back or seek an in-person evaluation if the symptoms worsen or if the condition fails to improve as anticipated.   Tommi Rumps, MD

## 2019-08-13 ENCOUNTER — Telehealth: Payer: Self-pay | Admitting: Family Medicine

## 2019-08-13 DIAGNOSIS — R05 Cough: Secondary | ICD-10-CM | POA: Insufficient documentation

## 2019-08-13 DIAGNOSIS — R059 Cough, unspecified: Secondary | ICD-10-CM | POA: Insufficient documentation

## 2019-08-13 DIAGNOSIS — F319 Bipolar disorder, unspecified: Secondary | ICD-10-CM | POA: Insufficient documentation

## 2019-08-13 NOTE — Assessment & Plan Note (Signed)
Physical exam completed.  Encouraged continued healthy diet and activity.  She is due for a Pap smear and we will have her come in to complete that.  She will keep her scheduled mammogram.  Discussed the importance of having yearly mammograms given her mom's history of breast cancer.  Discussed limiting alcohol intake to 7 beverages a week or less to no more than 3 beverages at a time.  Lab work to be completed when she comes in for her Pap smear.

## 2019-08-13 NOTE — Assessment & Plan Note (Signed)
Patient reports a history of bipolar disorder with depression.  Does feel depressed currently.  Discussed given the bipolar aspect it would be best to have her see a psychiatrist to discuss mood stabilizer versus antidepressant.  Referral will be placed.

## 2019-08-13 NOTE — Telephone Encounter (Signed)
I saw this patient this past week.  I put in for 2-week follow-up with her for a Pap smear though realized after the fact that she had a Covid test sometime over the weekend of the 19th.  Please contact her and get her rescheduled for a time where it is appropriate for her to come in following her Covid test.  Thanks.

## 2019-08-13 NOTE — Assessment & Plan Note (Signed)
Suspect post viral cough.  She reports negative Covid testing.  Symptoms have been progressively improving.  Discussed that it could take up to a month for a cough to resolve.  If she continues to have cough after a month we will reevaluate.

## 2019-08-14 NOTE — Telephone Encounter (Signed)
If the patient had her Covid test over the weekend of December 19. If no symptoms she will be fine to come into the office on 1.11.2021. That is passed the 21 day period.

## 2019-08-14 NOTE — Telephone Encounter (Signed)
Noted  

## 2019-08-23 ENCOUNTER — Telehealth: Payer: Self-pay | Admitting: Family Medicine

## 2019-08-23 DIAGNOSIS — R059 Cough, unspecified: Secondary | ICD-10-CM

## 2019-08-23 DIAGNOSIS — R05 Cough: Secondary | ICD-10-CM

## 2019-08-23 NOTE — Telephone Encounter (Signed)
Patient wants to know if you can prescribe her something for her ongoing cough, she is not having any other symptoms and she stated its been going on for at least 1 month.  Halimah Bewick,cma

## 2019-08-23 NOTE — Telephone Encounter (Signed)
Pt called wanting to know if she could have something called in for a on-going cough

## 2019-08-24 MED ORDER — BENZONATATE 200 MG PO CAPS: 200.0000 mg | ORAL_CAPSULE | Freq: Two times a day (BID) | ORAL | 0 refills | Status: DC | PRN

## 2019-08-24 NOTE — Telephone Encounter (Signed)
Patient needs to have a chest xray given that her cough has continued at this time. I have placed an order for this. She should have this done at [facility]. I sent in tessalon for her to try though she does need to get the x-ray done to make sure nothing else is causing the cough.

## 2019-08-24 NOTE — Telephone Encounter (Signed)
Patient wants to know if you can prescribe her something for her ongoing cough, she is not having any other symptoms and she stated its been going on for at least 1 month.  Bethanny Toelle,cma

## 2019-08-24 NOTE — Telephone Encounter (Signed)
Pt called and is still waiting to her from Dr. Caryl Bis.

## 2019-08-25 ENCOUNTER — Ambulatory Visit
Admission: RE | Admit: 2019-08-25 | Discharge: 2019-08-25 | Disposition: A | Discharge status: Home / Self Care | Payer: 59 | Source: Ambulatory Visit | Attending: Family Medicine | Admitting: Family Medicine

## 2019-08-25 DIAGNOSIS — R05 Cough: Secondary | ICD-10-CM

## 2019-08-25 DIAGNOSIS — R059 Cough, unspecified: Secondary | ICD-10-CM

## 2019-08-25 NOTE — Telephone Encounter (Signed)
Patient called back and I informed her that the provider wanted her to go to [facility] for a chest xray, patient inquired about an antibiotic, I informed her that the provider sent over medication for her cough and she had to wait for the chest xray results to see what the next step is, she understood.  Lynn Nelson,cma

## 2019-08-28 ENCOUNTER — Other Ambulatory Visit: Payer: Self-pay

## 2019-08-28 ENCOUNTER — Encounter: Payer: Self-pay | Admitting: Family Medicine

## 2019-08-28 ENCOUNTER — Ambulatory Visit (INDEPENDENT_AMBULATORY_CARE_PROVIDER_SITE_OTHER): Payer: 59 | Admitting: Family Medicine

## 2019-08-28 DIAGNOSIS — R05 Cough: Secondary | ICD-10-CM | POA: Diagnosis not present

## 2019-08-28 DIAGNOSIS — R059 Cough, unspecified: Secondary | ICD-10-CM

## 2019-08-28 DIAGNOSIS — F319 Bipolar disorder, unspecified: Secondary | ICD-10-CM

## 2019-08-28 NOTE — Assessment & Plan Note (Signed)
Previously referred to psychiatry.  We will check with our referral coordinator regarding the psychiatry referral.

## 2019-08-28 NOTE — Progress Notes (Signed)
Virtual Visit via video Note  This visit type was conducted due to national recommendations for restrictions regarding the COVID-19 pandemic (e.g. social distancing).  This format is felt to be most appropriate for this patient at this time.  All issues noted in this document were discussed and addressed.  No physical exam was performed (except for noted visual exam findings with Video Visits).   I connected with Lynn Nelson today at  3:15 PM EST by a video enabled telemedicine application and verified that I am speaking with the correct person using two identifiers. Location patient: work Location provider: work Persons participating in the virtual visit: patient, provider  I discussed the limitations, risks, security and privacy concerns of performing an evaluation and management service by telephone and the availability of in person appointments. I also discussed with the patient that there may be a patient responsible charge related to this service. The patient expressed understanding and agreed to proceed.  Reason for visit: follow-up  HPI: Cough: Patient notes this is gradually going away.  Mild cough currently.  Her postnasal drip has been improving and she is not coughing nearly as much at night.  No reflux.  No wheezing.  No shortness of breath.  Tessalon has been helpful.  Chest x-ray was unremarkable.  He does note some mild headaches with her bronchitis.  This has been going off and on since the bronchitis started.  Does not occur every day.  She does have a history of headaches that are similar when she does not sleep well or has decreased water intake.  No numbness, weakness, or vision changes.  Depression: She notes this is stable.  No SI.  She has not heard from psychiatry yet.   ROS: See pertinent positives and negatives per HPI.  Past Medical History:  Diagnosis Date  . Chicken pox   . Depression     Past Surgical History:  Procedure Laterality Date  .  AUGMENTATION MAMMAPLASTY Bilateral   . breast agumentation      BL  . BREAST SURGERY  2004   augmentation 2004  . DILATION AND CURETTAGE OF UTERUS     x2  . EYE SURGERY     corrective in 1986  . TUBAL LIGATION      Family History  Problem Relation Age of Onset  . Cancer Mother        breast cancer 5 years ago  . Breast cancer Mother 7  . Heart disease Father   . Stroke Maternal Grandmother   . Diabetes Maternal Grandmother   . Cancer Paternal Grandmother   . Alcohol abuse Paternal Grandmother   . Cancer Paternal Grandfather   . Alcohol abuse Paternal Grandfather     SOCIAL HX: Non-smoker   Current Outpatient Medications:  .  albuterol (PROVENTIL HFA;VENTOLIN HFA) 108 (90 Base) MCG/ACT inhaler, TAKE 2 PUFFS BY MOUTH EVERY 6 HOURS AS NEEDED FOR WHEEZE OR SHORTNESS OF BREATH, Disp: 8.5 Inhaler, Rfl: 0 .  benzonatate (TESSALON) 200 MG capsule, Take 1 capsule (200 mg total) by mouth 2 (two) times daily as needed for cough., Disp: 20 capsule, Rfl: 0  EXAM:  VITALS per patient if applicable:  GENERAL: alert, oriented, appears well and in no acute distress  HEENT: atraumatic, conjunttiva clear, no obvious abnormalities on inspection of external nose and ears  NECK: normal movements of the head and neck  LUNGS: on inspection no signs of respiratory distress, breathing rate appears normal, no obvious gross SOB, gasping or wheezing  CV: no obvious cyanosis  MS: moves all visible extremities without noticeable abnormality  PSYCH/NEURO: pleasant and cooperative, no obvious depression or anxiety, speech and thought processing grossly intact  ASSESSMENT AND PLAN:  Discussed the following assessment and plan:  Bipolar depression [facility]) Previously referred to psychiatry.  We will check with our referral coordinator regarding the psychiatry referral.  Cough Progressively improving.  Reassuring chest x-ray.  Discussed if it does not resolve in the next 1 to 2 weeks she should  let us know.  Headaches likely related to the bronchitis.  Those are progressively improving.  If they worsen in any manner or she develops new symptoms with them she will let us know right away.   No orders of the defined types were placed in this encounter.   No orders of the defined types were placed in this encounter.   Patient notes she was not scheduled for an in office visit for a Pap smear.  We will have her return in the next several weeks for this.   I discussed the assessment and treatment plan with the patient. The patient was provided an opportunity to ask questions and all were answered. The patient agreed with the plan and demonstrated an understanding of the instructions.   The patient was advised to call back or seek an in-person evaluation if the symptoms worsen or if the condition fails to improve as anticipated.    Tommi Rumps, MD

## 2019-08-28 NOTE — Assessment & Plan Note (Signed)
Progressively improving.  Reassuring chest x-ray.  Discussed if it does not resolve in the next 1 to 2 weeks she should let us know.  Headaches likely related to the bronchitis.  Those are progressively improving.  If they worsen in any manner or she develops new symptoms with them she will let us know right away.

## 2019-09-11 ENCOUNTER — Ambulatory Visit
Admission: RE | Admit: 2019-09-11 | Discharge: 2019-09-11 | Disposition: A | Discharge status: Home / Self Care | Payer: 59 | Source: Ambulatory Visit | Attending: Family Medicine | Admitting: Family Medicine

## 2019-09-11 DIAGNOSIS — Z1231 Encounter for screening mammogram for malignant neoplasm of breast: Secondary | ICD-10-CM

## 2019-09-20 ENCOUNTER — Ambulatory Visit: Payer: 59 | Admitting: Family Medicine

## 2019-10-26 ENCOUNTER — Telehealth: Payer: Self-pay | Admitting: Family Medicine

## 2019-10-26 NOTE — Telephone Encounter (Signed)
I called and left vm for [facility] to return my call.

## 2019-10-26 NOTE — Telephone Encounter (Signed)
I also sent a Mychart msg to pt she may call to speak to me.

## 2019-11-01 ENCOUNTER — Ambulatory Visit: Payer: 59 | Admitting: Family Medicine

## 2019-12-04 ENCOUNTER — Ambulatory Visit (INDEPENDENT_AMBULATORY_CARE_PROVIDER_SITE_OTHER): Payer: 59 | Admitting: Psychology

## 2019-12-04 DIAGNOSIS — F418 Other specified anxiety disorders: Secondary | ICD-10-CM | POA: Diagnosis not present

## 2019-12-08 ENCOUNTER — Other Ambulatory Visit: Payer: Self-pay

## 2019-12-08 ENCOUNTER — Encounter: Payer: Self-pay | Admitting: Family Medicine

## 2019-12-08 ENCOUNTER — Other Ambulatory Visit (HOSPITAL_COMMUNITY)
Admission: RE | Admit: 2019-12-08 | Discharge: 2019-12-08 | Disposition: A | Discharge status: Home / Self Care | Payer: 59 | Source: Ambulatory Visit | Attending: Family Medicine | Admitting: Family Medicine

## 2019-12-08 ENCOUNTER — Ambulatory Visit: Payer: 59 | Admitting: Family Medicine

## 2019-12-08 VITALS — BP 115/70 | HR 75 | Temp 96.7°F | Ht 64.0 in | Wt 146.4 lb

## 2019-12-08 DIAGNOSIS — F319 Bipolar disorder, unspecified: Secondary | ICD-10-CM | POA: Diagnosis not present

## 2019-12-08 DIAGNOSIS — Z124 Encounter for screening for malignant neoplasm of cervix: Secondary | ICD-10-CM | POA: Insufficient documentation

## 2019-12-08 DIAGNOSIS — Z1239 Encounter for other screening for malignant neoplasm of breast: Secondary | ICD-10-CM | POA: Insufficient documentation

## 2019-12-08 DIAGNOSIS — Z13220 Encounter for screening for lipoid disorders: Secondary | ICD-10-CM | POA: Diagnosis not present

## 2019-12-08 NOTE — Assessment & Plan Note (Signed)
Check labs 

## 2019-12-08 NOTE — Patient Instructions (Signed)
Nice to see you. Please get in touch with the psychiatrist. We will contact you with your Pap smear results.

## 2019-12-08 NOTE — Assessment & Plan Note (Signed)
I encouraged her to contact the psychiatrist to get set up with them.

## 2019-12-08 NOTE — Progress Notes (Signed)
  Lynn Rumps, MD Phone: [phone]  Lynn Nelson is a 44 y.o. female who presents today for f/u.  Depression/anxiety: notes these things have improved somewhat.  She did talk to her therapist and they gave her a list of psychiatrist that she needs to call.  The psychiatrist was referred her to did not take her insurance.  She notes her moods come and go.  Does not like being around crowds.  She is ready for better balance and wants to be happy more so than worried.  No SI.  Patient is due for breast exam and Pap smear as her last physical was virtual.  Social History   Tobacco Use  Smoking Status Never Smoker  Smokeless Tobacco Never Used     ROS see history of present illness  Objective  Physical Exam Vitals:   12/08/19 1427  BP: 115/70  Pulse: 75  Temp: (!) 96.7 F (35.9 C)  SpO2: 98%    BP Readings from Last 3 Encounters:  12/08/19 115/70  10/24/18 (!) 92/58  08/22/18 98/62   Wt Readings from Last 3 Encounters:  12/08/19 146 lb 6.4 oz (66.4 kg)  08/28/19 145 lb (65.8 kg)  08/09/19 150 lb (68 kg)    Physical Exam Constitutional:      General: She is not in acute distress. Pulmonary:     Effort: Pulmonary effort is normal.  Genitourinary:    Comments: Lynn Nelson, CMA served as chaperone, normal labia, normal vaginal opening, normal vaginal mucosa, normal-appearing cervix, no adnexal tenderness or masses, normal bimanual exam, bilateral breast with no skin changes, nipple inversion, masses, or tenderness, no axillary masses bilaterally Neurological:     Mental Status: She is alert.      Assessment/Plan: Please see individual problem list.  Bipolar depression ([place]) I encouraged her to contact the psychiatrist to get set up with them.  Breast cancer screening Prior mammogram negative.  Breast exam today negative.  Cervical cancer screening Pap smear completed.  We will contact her with the results.  Lipid screening Check labs.      Orders Placed This Encounter  Procedures  . Comp Met (CMET)  . Lipid panel    No orders of the defined types were placed in this encounter.   This visit occurred during the SARS-CoV-2 public health emergency.  Safety protocols were in place, including screening questions prior to the visit, additional usage of staff PPE, and extensive cleaning of exam room while observing appropriate contact time as indicated for disinfecting solutions.    Lynn Rumps, MD [place]

## 2019-12-08 NOTE — Assessment & Plan Note (Signed)
Prior mammogram negative.  Breast exam today negative.

## 2019-12-08 NOTE — Assessment & Plan Note (Addendum)
Pap smear completed.  We will contact her with the results.

## 2019-12-09 LAB — LIPID PANEL
Cholesterol: 186 mg/dL (ref <200)
HDL: 73 mg/dL (ref >=50)
LDL Cholesterol (Calc): 98 mg/dL (calc)
Non-HDL Cholesterol (Calc): 113 mg/dL (calc) (ref <130)
Total CHOL/HDL Ratio: 2.5 (calc) (ref <5.0)
Triglycerides: 61 mg/dL (ref <150)

## 2019-12-09 LAB — COMPREHENSIVE METABOLIC PANEL
AG Ratio: 1.8 (calc) (ref 1.0–2.5)
ALT: 16 U/L (ref 6–29)
AST: 18 U/L (ref 10–30)
Albumin: 4.2 g/dL (ref 3.6–5.1)
Alkaline phosphatase (APISO): 56 U/L (ref 31–125)
BUN: 14 mg/dL (ref 7–25)
CO2: 28 mmol/L (ref 20–32)
Calcium: 9.5 mg/dL (ref 8.6–10.2)
Chloride: 104 mmol/L (ref 98–110)
Creat: 0.81 mg/dL (ref 0.50–1.10)
Globulin: 2.4 g/dL (calc) (ref 1.9–3.7)
Glucose, Bld: 92 mg/dL (ref 65–99)
Potassium: 3.9 mmol/L (ref 3.5–5.3)
Sodium: 138 mmol/L (ref 135–146)
Total Bilirubin: 0.4 mg/dL (ref 0.2–1.2)
Total Protein: 6.6 g/dL (ref 6.1–8.1)

## 2019-12-11 LAB — CYTOLOGY - PAP
Comment: NEGATIVE
Diagnosis: NEGATIVE
High risk HPV: NEGATIVE

## 2019-12-12 ENCOUNTER — Other Ambulatory Visit: Payer: Self-pay | Admitting: Family Medicine

## 2019-12-12 MED ORDER — FLUCONAZOLE 150 MG PO TABS
150.0000 mg | ORAL_TABLET | Freq: Once | ORAL | 0 refills | Status: AC
Start: 2019-12-12 — End: 2019-12-12

## 2019-12-19 ENCOUNTER — Ambulatory Visit: Payer: 59 | Admitting: Psychology

## 2020-08-02 ENCOUNTER — Encounter: Payer: 59 | Admitting: Family Medicine

## 2020-09-09 ENCOUNTER — Encounter: Payer: 59 | Admitting: Family Medicine

## 2020-10-23 ENCOUNTER — Encounter: Payer: Self-pay | Admitting: Family Medicine

## 2020-10-23 ENCOUNTER — Ambulatory Visit (INDEPENDENT_AMBULATORY_CARE_PROVIDER_SITE_OTHER): Payer: 59 | Admitting: Family Medicine

## 2020-10-23 ENCOUNTER — Telehealth: Payer: Self-pay | Admitting: Family Medicine

## 2020-10-23 ENCOUNTER — Other Ambulatory Visit: Payer: Self-pay

## 2020-10-23 VITALS — BP 120/70 | HR 75 | Temp 98.1°F | Ht 64.0 in | Wt 148.4 lb

## 2020-10-23 DIAGNOSIS — Z13220 Encounter for screening for lipoid disorders: Secondary | ICD-10-CM

## 2020-10-23 DIAGNOSIS — Z130 Encounter for screening for diseases of the blood and blood-forming organs and certain disorders involving the immune mechanism: Secondary | ICD-10-CM

## 2020-10-23 DIAGNOSIS — Z0000 Encounter for general adult medical examination without abnormal findings: Secondary | ICD-10-CM

## 2020-10-23 DIAGNOSIS — Z1329 Encounter for screening for other suspected endocrine disorder: Secondary | ICD-10-CM

## 2020-10-23 DIAGNOSIS — E663 Overweight: Secondary | ICD-10-CM

## 2020-10-23 DIAGNOSIS — Z1231 Encounter for screening mammogram for malignant neoplasm of breast: Secondary | ICD-10-CM | POA: Diagnosis not present

## 2020-10-23 DIAGNOSIS — Z1159 Encounter for screening for other viral diseases: Secondary | ICD-10-CM

## 2020-10-23 NOTE — Patient Instructions (Signed)
Nice to see you. We will get labs today contact you with the results. Please call [phone] to schedule your mammogram.
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
## 2020-10-23 NOTE — Addendum Note (Signed)
Addended by: Leeanne Rio on: 10/23/2020 04:24 PM   Modules accepted: Orders

## 2020-10-23 NOTE — Telephone Encounter (Signed)
Please let the patient know that given her mother's history of rectal cancer she does meet criteria for evaluation with colonoscopy.  I will place a referral once you speak with her.

## 2020-10-23 NOTE — Assessment & Plan Note (Addendum)
Physical exam completed.  Encouraged continued healthy diet and activity level.  Pap smear is up-to-date.  Discussed guidelines recommending Pap smears every 5 years.  Mammogram is due and has been ordered.  The patient will call to get this scheduled.  Based on review of colorectal cancer screening guidelines after the patient left it was determined that she would meet criteria for a colonoscopy.  We will contact her and let her know and then place a referral.  Vaccines are up-to-date.  I encouraged her to see a dentist and an ophthalmologist.  Lab work as outlined.

## 2020-10-24 LAB — COMPREHENSIVE METABOLIC PANEL
AG Ratio: 2 (calc) (ref 1.0–2.5)
ALT: 14 U/L (ref 6–29)
AST: 17 U/L (ref 10–30)
Albumin: 4.5 g/dL (ref 3.6–5.1)
Alkaline phosphatase (APISO): 48 U/L (ref 31–125)
BUN: 14 mg/dL (ref 7–25)
CO2: 25 mmol/L (ref 20–32)
Calcium: 9.9 mg/dL (ref 8.6–10.2)
Chloride: 105 mmol/L (ref 98–110)
Creat: 0.72 mg/dL (ref 0.50–1.10)
Globulin: 2.3 g/dL (calc) (ref 1.9–3.7)
Glucose, Bld: 77 mg/dL (ref 65–99)
Potassium: 4.2 mmol/L (ref 3.5–5.3)
Sodium: 140 mmol/L (ref 135–146)
Total Bilirubin: 0.3 mg/dL (ref 0.2–1.2)
Total Protein: 6.8 g/dL (ref 6.1–8.1)

## 2020-10-24 LAB — LIPID PANEL
Cholesterol: 192 mg/dL (ref <200)
HDL: 64 mg/dL (ref >=50)
LDL Cholesterol (Calc): 113 mg/dL (calc) — ABNORMAL HIGH
Non-HDL Cholesterol (Calc): 128 mg/dL (calc) (ref <130)
Total CHOL/HDL Ratio: 3 (calc) (ref <5.0)
Triglycerides: 68 mg/dL (ref <150)

## 2020-10-24 LAB — CBC WITH DIFFERENTIAL/PLATELET
Absolute Monocytes: 740 cells/uL (ref 200–950)
Basophils Absolute: 51 cells/uL (ref 0–200)
Basophils Relative: 0.6 %
Eosinophils Absolute: 68 cells/uL (ref 15–500)
Eosinophils Relative: 0.8 %
HCT: 38.1 % (ref 35.0–45.0)
Hemoglobin: 12.8 g/dL (ref 11.7–15.5)
Lymphs Abs: 2372 cells/uL (ref 850–3900)
MCH: 31.8 pg (ref 27.0–33.0)
MCHC: 33.6 g/dL (ref 32.0–36.0)
MCV: 94.5 fL (ref 80.0–100.0)
MPV: 10.8 fL (ref 7.5–12.5)
Monocytes Relative: 8.7 %
Neutro Abs: 5270 cells/uL (ref 1500–7800)
Neutrophils Relative %: 62 %
Platelets: 360 10*3/uL (ref 140–400)
RBC: 4.03 10*6/uL (ref 3.80–5.10)
RDW: 11.8 % (ref 11.0–15.0)
Total Lymphocyte: 27.9 %
WBC: 8.5 10*3/uL (ref 3.8–10.8)

## 2020-10-24 LAB — HEMOGLOBIN A1C
Hgb A1c MFr Bld: 4.9 % of total Hgb (ref <5.7)
Mean Plasma Glucose: 94 mg/dL
eAG (mmol/L): 5.2 mmol/L

## 2020-10-24 LAB — HEPATITIS C ANTIBODY
Hepatitis C Ab: NONREACTIVE
SIGNAL TO CUT-OFF: 0.01 (ref <1.00)

## 2020-10-24 LAB — TSH: TSH: 2.24 mIU/L

## 2020-10-24 NOTE — Telephone Encounter (Signed)
I called and spoke with the patient and informed her that the provider thought it would be a great idea to have a colonoscopy, the patient stated that she cannot afford to take off anymore time from work for a procedure for the next few months and she declined but,  She stated she could do a cologuard if you think it would be of any help but not the colonoscopy to take off of work.  Please advise.  Jayse Hodkinson,cma

## 2020-10-25 ENCOUNTER — Other Ambulatory Visit: Payer: Self-pay | Admitting: Family Medicine

## 2020-10-25 DIAGNOSIS — Z1231 Encounter for screening mammogram for malignant neoplasm of breast: Secondary | ICD-10-CM

## 2020-10-26 NOTE — Telephone Encounter (Signed)
Unfortunately with her family history of rectal cancer she cannot complete Cologuard.  I would encourage her to see GI for colonoscopy.  If she would like I could go ahead and place a referral and they can schedule her a few months out if that would be her preference.

## 2020-10-29 NOTE — Telephone Encounter (Signed)
LVM for the patient to call back for discussion of colonoscopy.  Nina,cma

## 2020-11-14 NOTE — Telephone Encounter (Signed)
Patient denied a colonoscopy.  Chelsa Stout,cma

## 2020-11-25 ENCOUNTER — Ambulatory Visit
Admission: RE | Admit: 2020-11-25 | Discharge: 2020-11-25 | Disposition: A | Discharge status: Home / Self Care | Payer: 59 | Source: Ambulatory Visit | Attending: Family Medicine | Admitting: Family Medicine

## 2020-11-25 ENCOUNTER — Other Ambulatory Visit: Payer: Self-pay

## 2020-11-25 DIAGNOSIS — Z1231 Encounter for screening mammogram for malignant neoplasm of breast: Secondary | ICD-10-CM | POA: Insufficient documentation

## 2021-05-20 DIAGNOSIS — S93402A Sprain of unspecified ligament of left ankle, initial encounter: Secondary | ICD-10-CM | POA: Diagnosis not present

## 2021-05-20 DIAGNOSIS — M25472 Effusion, left ankle: Secondary | ICD-10-CM | POA: Diagnosis not present

## 2021-05-20 DIAGNOSIS — M25572 Pain in left ankle and joints of left foot: Secondary | ICD-10-CM | POA: Diagnosis not present

## 2021-05-20 DIAGNOSIS — W109XXA Fall (on) (from) unspecified stairs and steps, initial encounter: Secondary | ICD-10-CM | POA: Diagnosis not present

## 2021-05-20 DIAGNOSIS — S99912A Unspecified injury of left ankle, initial encounter: Secondary | ICD-10-CM | POA: Diagnosis not present

## 2021-05-20 DIAGNOSIS — F17200 Nicotine dependence, unspecified, uncomplicated: Secondary | ICD-10-CM | POA: Diagnosis not present

## 2021-05-20 DIAGNOSIS — X501XXA Overexertion from prolonged static or awkward postures, initial encounter: Secondary | ICD-10-CM | POA: Diagnosis not present

## 2021-05-20 DIAGNOSIS — Z880 Allergy status to penicillin: Secondary | ICD-10-CM | POA: Diagnosis not present

## 2021-06-06 DIAGNOSIS — S93402A Sprain of unspecified ligament of left ankle, initial encounter: Secondary | ICD-10-CM | POA: Diagnosis not present

## 2021-06-06 DIAGNOSIS — M2012 Hallux valgus (acquired), left foot: Secondary | ICD-10-CM | POA: Diagnosis not present

## 2021-07-04 DIAGNOSIS — S93402D Sprain of unspecified ligament of left ankle, subsequent encounter: Secondary | ICD-10-CM | POA: Diagnosis not present

## 2021-07-04 DIAGNOSIS — M2012 Hallux valgus (acquired), left foot: Secondary | ICD-10-CM | POA: Diagnosis not present

## 2021-07-17 DIAGNOSIS — M2012 Hallux valgus (acquired), left foot: Secondary | ICD-10-CM | POA: Diagnosis not present

## 2021-07-18 ENCOUNTER — Other Ambulatory Visit: Payer: BC Managed Care – PPO

## 2021-07-18 ENCOUNTER — Telehealth (INDEPENDENT_AMBULATORY_CARE_PROVIDER_SITE_OTHER): Payer: BC Managed Care – PPO | Admitting: Nurse Practitioner

## 2021-07-18 ENCOUNTER — Other Ambulatory Visit: Payer: Self-pay

## 2021-07-18 VITALS — Temp 99.5°F

## 2021-07-18 DIAGNOSIS — J029 Acute pharyngitis, unspecified: Secondary | ICD-10-CM | POA: Diagnosis not present

## 2021-07-18 LAB — POCT RAPID STREP A (OFFICE): Rapid Strep A Screen: NEGATIVE

## 2021-07-18 NOTE — Assessment & Plan Note (Signed)
Patient sounds like she is been working through a viral illness.  With a sore throat starting last couple days.  Does have history of strep.  Did get her to come test in office strep test was negative.  Did call and discussed this with patient she can continue over-the-counter medications and analgesics as needed also suggest that she does warm salt water gargles.  Did discuss possibility of her having mono.  Did discuss for her to continue supportive treatment over the weekend reach out Monday if she is not improving.  She acknowledged

## 2021-07-18 NOTE — Progress Notes (Signed)
Patient ID: Lynn Nelson, female    DOB: [birth year], 45 y.o.   MRN: [id number]  Virtual visit completed through [employer], a video enabled telemedicine application. Due to national recommendations of social distancing due to COVID-19, a virtual visit is felt to be most appropriate for this patient at this time. Reviewed limitations, risks, security and privacy concerns of performing a virtual visit and the availability of in person appointments. I also reviewed that there may be a patient responsible charge related to this service. The patient agreed to proceed.   Patient location: home Provider location: [place] [facility], office Persons participating in this virtual visit: patient, provider   If any vitals were documented, they were collected by patient at home unless specified below.    Temp 99.5 F (37.5 C) Comment: per patient  LMP 06/22/2021    CC: Sore Throat Subjective:   HPI: Lynn Nelson is a 45 y.o. female presenting on 07/18/2021 for Cough (About 1 week ago started with sneezing, runny nose, cough and it has gotten better, the last 3 to 4 days she has developed bad sore throat, fatigue, saw a couple of white patches in her throat, has hx of strep. )  Cough started approx 1 week ago and has gotten better Approx 3-4 days ago she started having a sore throat and fatigue At home test covid 07/17/2021 Hx of strep Sick contacts: both of her sons were sick for ear and sinus infections a couple weeks ago  Ibuprofen or aleve. Cold and flu medication   Phone call 6 minutes and 17 seconds   Relevant past medical, surgical, family and social history reviewed and updated as indicated. Interim medical history since our last visit reviewed. Allergies and medications reviewed and updated. Outpatient Medications Prior to Visit  Medication Sig Dispense Refill   albuterol (PROVENTIL HFA;VENTOLIN HFA) 108 (90 Base) MCG/ACT inhaler TAKE 2 PUFFS BY MOUTH EVERY 6 HOURS AS  NEEDED FOR WHEEZE OR SHORTNESS OF BREATH (Patient not taking: No sig reported) 8.5 Inhaler 0   No facility-administered medications prior to visit.     Per HPI unless specifically indicated in ROS section below Review of Systems  Constitutional:  Positive for chills, fatigue and fever.  HENT:  Positive for rhinorrhea and sore throat. Negative for congestion, ear discharge, ear pain, sinus pressure and sinus pain.   Respiratory:  Positive for cough (intermittent). Negative for shortness of breath.   Cardiovascular:  Negative for chest pain.  Gastrointestinal:  Negative for diarrhea, nausea and vomiting.  Endocrine: Positive for polydipsia.  Musculoskeletal:  Negative for arthralgias and myalgias.  Neurological:  Negative for headaches.  Objective:  Temp 99.5 F (37.5 C) Comment: per patient  LMP 06/22/2021   Wt Readings from Last 3 Encounters:  10/23/20 148 lb 6.4 oz (67.3 kg)  12/08/19 146 lb 6.4 oz (66.4 kg)  08/28/19 145 lb (65.8 kg)       Physical exam: Gen: alert, NAD, not ill appearing Pulm: speaks in complete sentences without increased work of breathing Psych: normal mood, normal thought content      Results for orders placed or performed in visit on 10/23/20  Comp Met (CMET)  Result Value Ref Range   Glucose, Bld 77 65 - 99 mg/dL   BUN 14 7 - 25 mg/dL   Creat 0.72 0.50 - 1.10 mg/dL   BUN/Creatinine Ratio NOT APPLICABLE 6 - 22 (calc)   Sodium 140 135 - 146 mmol/L   Potassium 4.2 3.5 -  5.3 mmol/L   Chloride 105 98 - 110 mmol/L   CO2 25 20 - 32 mmol/L   Calcium 9.9 8.6 - 10.2 mg/dL   Total Protein 6.8 6.1 - 8.1 g/dL   Albumin 4.5 3.6 - 5.1 g/dL   Globulin 2.3 1.9 - 3.7 g/dL (calc)   AG Ratio 2.0 1.0 - 2.5 (calc)   Total Bilirubin 0.3 0.2 - 1.2 mg/dL   Alkaline phosphatase (APISO) 48 31 - 125 U/L   AST 17 10 - 30 U/L   ALT 14 6 - 29 U/L  Lipid panel  Result Value Ref Range   Cholesterol 192 <200 mg/dL   HDL 64 > OR = 50 mg/dL   Triglycerides 68 <150 mg/dL    LDL Cholesterol (Calc) 113 (H) mg/dL (calc)   Total CHOL/HDL Ratio 3.0 <5.0 (calc)   Non-HDL Cholesterol (Calc) 128 <130 mg/dL (calc)  TSH  Result Value Ref Range   TSH 2.24 mIU/L  HgB A1c  Result Value Ref Range   Hgb A1c MFr Bld 4.9 <5.7 % of total Hgb   Mean Plasma Glucose 94 mg/dL   eAG (mmol/L) 5.2 mmol/L  CBC w/Diff  Result Value Ref Range   WBC 8.5 3.8 - 10.8 Thousand/uL   RBC 4.03 3.80 - 5.10 Million/uL   Hemoglobin 12.8 11.7 - 15.5 g/dL   HCT 38.1 35.0 - 45.0 %   MCV 94.5 80.0 - 100.0 fL   MCH 31.8 27.0 - 33.0 pg   MCHC 33.6 32.0 - 36.0 g/dL   RDW 11.8 11.0 - 15.0 %   Platelets 360 140 - 400 Thousand/uL   MPV 10.8 7.5 - 12.5 fL   Neutro Abs 5,270 1,500 - 7,800 cells/uL   Lymphs Abs 2,372 850 - 3,900 cells/uL   Absolute Monocytes 740 200 - 950 cells/uL   Eosinophils Absolute 68 15 - 500 cells/uL   Basophils Absolute 51 0 - 200 cells/uL   Neutrophils Relative % 62 %   Total Lymphocyte 27.9 %   Monocytes Relative 8.7 %   Eosinophils Relative 0.8 %   Basophils Relative 0.6 %  Hepatitis C Antibody  Result Value Ref Range   Hepatitis C Ab NON-REACTIVE NON-REACTI   SIGNAL TO CUT-OFF 0.01 <1.00   Assessment & Plan:   Problem List Items Addressed This Visit       Other   Sore throat - Primary    Patient sounds like she is been working through a viral illness.  With a sore throat starting last couple days.  Does have history of strep.  Did get her to come test in office strep test was negative.  Did call and discussed this with patient she can continue over-the-counter medications and analgesics as needed also suggest that she does warm salt water gargles.  Did discuss possibility of her having mono.  Did discuss for her to continue supportive treatment over the weekend reach out Monday if she is not improving.  She acknowledged      Relevant Orders   Rapid Strep A (Completed)     No orders of the defined types were placed in this encounter.  No orders of the  defined types were placed in this encounter.   I discussed the assessment and treatment plan with the patient. The patient was provided an opportunity to ask questions and all were answered. The patient agreed with the plan and demonstrated an understanding of the instructions. The patient was advised to call back or seek an in-person evaluation  if the symptoms worsen or if the condition fails to improve as anticipated.  Follow up plan: No follow-ups on file.  Romilda Garret, NP

## 2021-09-02 DIAGNOSIS — M25375 Other instability, left foot: Secondary | ICD-10-CM | POA: Diagnosis not present

## 2021-09-02 DIAGNOSIS — Z880 Allergy status to penicillin: Secondary | ICD-10-CM | POA: Diagnosis not present

## 2021-09-02 DIAGNOSIS — G8918 Other acute postprocedural pain: Secondary | ICD-10-CM | POA: Diagnosis not present

## 2021-09-02 DIAGNOSIS — M2012 Hallux valgus (acquired), left foot: Secondary | ICD-10-CM | POA: Diagnosis not present

## 2021-09-02 HISTORY — PX: OTHER SURGICAL HISTORY: SHX169

## 2021-09-19 ENCOUNTER — Ambulatory Visit: Payer: BC Managed Care – PPO | Admitting: Internal Medicine

## 2021-10-09 ENCOUNTER — Telehealth: Payer: Self-pay

## 2021-10-09 NOTE — Telephone Encounter (Signed)
Attempted to call pt to complete pre-visit planning. LM for pt to cb.

## 2021-10-09 NOTE — Telephone Encounter (Signed)
Pt returning call

## 2021-10-10 ENCOUNTER — Other Ambulatory Visit: Payer: Self-pay

## 2021-10-10 ENCOUNTER — Ambulatory Visit: Payer: BC Managed Care – PPO | Admitting: Internal Medicine

## 2021-10-10 ENCOUNTER — Encounter: Payer: Self-pay | Admitting: Internal Medicine

## 2021-10-10 VITALS — BP 128/80 | HR 71 | Temp 98.4°F | Ht 64.0 in | Wt 151.0 lb

## 2021-10-10 DIAGNOSIS — Z1389 Encounter for screening for other disorder: Secondary | ICD-10-CM | POA: Diagnosis not present

## 2021-10-10 DIAGNOSIS — Z13220 Encounter for screening for lipoid disorders: Secondary | ICD-10-CM | POA: Diagnosis not present

## 2021-10-10 DIAGNOSIS — Z23 Encounter for immunization: Secondary | ICD-10-CM

## 2021-10-10 DIAGNOSIS — K921 Melena: Secondary | ICD-10-CM

## 2021-10-10 DIAGNOSIS — E559 Vitamin D deficiency, unspecified: Secondary | ICD-10-CM

## 2021-10-10 DIAGNOSIS — K6289 Other specified diseases of anus and rectum: Secondary | ICD-10-CM | POA: Diagnosis not present

## 2021-10-10 DIAGNOSIS — Z1329 Encounter for screening for other suspected endocrine disorder: Secondary | ICD-10-CM

## 2021-10-10 DIAGNOSIS — K649 Unspecified hemorrhoids: Secondary | ICD-10-CM

## 2021-10-10 DIAGNOSIS — Z0000 Encounter for general adult medical examination without abnormal findings: Secondary | ICD-10-CM

## 2021-10-10 LAB — HEMOCCULT GUIAC POC 1CARD (OFFICE): Fecal Occult Blood, POC: NEGATIVE
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
## 2021-10-10 NOTE — Patient Instructions (Addendum)
MD Physician   Primary Contact Information  Phone Fax E-mail Address  [phone] Not available [place] [phone]     Specialties     Gastroenterology       Hemorrhoids Hemorrhoids are swollen veins in and around the rectum or anus. There are two types of hemorrhoids: Internal hemorrhoids. These occur in the veins that are just inside the rectum. They may poke through to the outside and become irritated and painful. External hemorrhoids. These occur in the veins that are outside the anus and can be felt as a painful swelling or hard lump near the anus. Most hemorrhoids do not cause serious problems, and they can be managed with home treatments such as diet and lifestyle changes. If home treatments do not help the symptoms, procedures can be done to shrink or remove the hemorrhoids. What are the causes? This condition is caused by increased pressure in the anal area. This pressure may result from various things, including: Constipation. Straining to have a bowel movement. Diarrhea. Pregnancy. Obesity. Sitting for long periods of time. Heavy lifting or other activity that causes you to strain. Anal sex. Riding a bike for a long period of time. What are the signs or symptoms? Symptoms of this condition include: Pain. Anal itching or irritation. Rectal bleeding. Leakage of stool (feces). Anal swelling. One or more lumps around the anus. How is this diagnosed? This condition can often be diagnosed through a visual exam. Other exams or tests may also be done, such as: An exam that involves feeling the rectal area with a gloved hand (digital rectal exam). An exam of the anal canal that is done using a small tube (anoscope). A blood test, if you have lost a significant amount of blood. A test to look inside the colon using a flexible tube with a camera on the end (sigmoidoscopy or colonoscopy). How is this treated? This condition can usually  be treated at home. However, various procedures may be done if dietary changes, lifestyle changes, and other home treatments do not help your symptoms. These procedures can help make the hemorrhoids smaller or remove them completely. Some of these procedures involve surgery, and others do not. Common procedures include: Rubber band ligation. Rubber bands are placed at the base of the hemorrhoids to cut off their blood supply. Sclerotherapy. Medicine is injected into the hemorrhoids to shrink them. Infrared coagulation. A type of light energy is used to get rid of the hemorrhoids. Hemorrhoidectomy surgery. The hemorrhoids are surgically removed, and the veins that supply them are tied off. Stapled hemorrhoidopexy surgery. The surgeon staples the base of the hemorrhoid to the rectal wall. Follow these instructions at home: Eating and drinking  Eat foods that have a lot of fiber in them, such as whole grains, beans, nuts, fruits, and vegetables. Ask your health care provider about taking products that have added fiber (fiber supplements). Reduce the amount of fat in your diet. You can do this by eating low-fat dairy products, eating less red meat, and avoiding processed foods. Drink enough fluid to keep your urine pale yellow. Managing pain and swelling  Take warm sitz baths for 20 minutes, 3-4 times a day to ease pain and discomfort. You may do this in a bathtub or using a portable sitz bath that fits over the toilet. If directed, apply ice to the affected area. Using ice packs between sitz baths may be helpful. Put ice in a plastic bag. Place a towel  between your skin and the bag. Leave the ice on for 20 minutes, 2-3 times a day. General instructions Take over-the-counter and prescription medicines only as told by your health care provider. Use medicated creams or suppositories as told. Get regular exercise. Ask your health care provider how much and what kind of exercise is best for you. In  general, you should do moderate exercise for at least 30 minutes on most days of the week (150 minutes each week). This can include activities such as walking, biking, or yoga. Go to the bathroom when you have the urge to have a bowel movement. Do not wait. Avoid straining to have bowel movements. Keep the anal area dry and clean. Use wet toilet paper or moist towelettes after a bowel movement. Do not sit on the toilet for long periods of time. This increases blood pooling and pain. Keep all follow-up visits as told by your health care provider. This is important. Contact a health care provider if you have: Increasing pain and swelling that are not controlled by treatment or medicine. Difficulty having a bowel movement, or you are unable to have a bowel movement. Pain or inflammation outside the area of the hemorrhoids. Get help right away if you have: Uncontrolled bleeding from your rectum. Summary Hemorrhoids are swollen veins in and around the rectum or anus. Most hemorrhoids can be managed with home treatments such as diet and lifestyle changes. Taking warm sitz baths can help ease pain and discomfort. In severe cases, procedures or surgery can be done to shrink or remove the hemorrhoids. This information is not intended to replace advice given to you by your health care provider. Make sure you discuss any questions you have with your health care provider. Document Revised: 02/12/2021 Document Reviewed: 02/12/2021 Elsevier Patient Education  [place].   Colonoscopy, Adult A colonoscopy is a procedure to look at the entire large intestine. This procedure is done using a long, thin, flexible tube that has a camera on the end. You may have a colonoscopy: As a part of normal colorectal screening. If you have certain symptoms, such as: A low number of red blood cells in your blood (anemia). Diarrhea that does not go away. Pain in your abdomen. Blood in your stool. A  colonoscopy can help screen for and diagnose medical problems, including: Tumors. Extra tissue that grows where mucus forms (polyps). Inflammation. Areas of bleeding. Tell your health care provider about: Any allergies you have. All medicines you are taking, including vitamins, herbs, eye drops, creams, and over-the-counter medicines. Any problems you or family members have had with anesthetic medicines. Any blood disorders you have. Any surgeries you have had. Any medical conditions you have. Any problems you have had with having bowel movements. Whether you are pregnant or may be pregnant. What are the risks? Generally, this is a safe procedure. However, problems may occur, including: Bleeding. Damage to your intestine. Allergic reactions to medicines given during the procedure. Infection. This is rare. What happens before the procedure? Eating and drinking restrictions Follow instructions from your health care provider about eating or drinking restrictions, which may include: A few days before the procedure: Follow a low-fiber diet. Avoid nuts, seeds, dried fruit, raw fruits, and vegetables. 1-3 days before the procedure: Eat only gelatin dessert or ice pops. Drink only clear liquids, such as water, clear juice, clear broth or bouillon, black coffee or tea, or clear soft drinks or sports drinks. Avoid liquids that contain red or purple dye. The  day of the procedure: Do not eat solid foods. You may continue to drink clear liquids until up to 2 hours before the procedure. Do not eat or drink anything starting 2 hours before the procedure, or within the time period that your health care provider recommends. Bowel prep If you were prescribed a bowel prep to take by mouth (orally) to clean out your colon: Take it as told by your health care provider. Starting the day before your procedure, you will need to drink a large amount of liquid medicine. The liquid will cause you to have  many bowel movements of loose stool until your stool becomes almost clear or light green. If your skin or the opening between the buttocks (anus) gets irritated from diarrhea, you may relieve the irritation using: Wipes with medicine in them, such as adult wet wipes with aloe and vitamin E. A product to soothe skin, such as petroleum jelly. If you vomit while drinking the bowel prep: Take a break for up to 60 minutes. Begin the bowel prep again. Call your health care provider if you keep vomiting or you cannot take the bowel prep without vomiting. To clean out your colon, you may also be given: Laxative medicines. These help you have a bowel movement. Instructions for enema use. An enema is liquid medicine injected into your rectum. Medicines Ask your health care provider about: Changing or stopping your regular medicines or supplements. This is especially important if you are taking iron supplements, diabetes medicines, or blood thinners. Taking medicines such as aspirin and ibuprofen. These medicines can thin your blood. Do not take these medicines unless your health care provider tells you to take them. Taking over-the-counter medicines, vitamins, herbs, and supplements. General instructions Ask your health care provider what steps will be taken to help prevent infection. These may include washing skin with a germ-killing soap. Plan to have someone take you home from the hospital or clinic. What happens during the procedure?  An IV will be inserted into one of your veins. You may be given one or more of the following: A medicine to help you relax (sedative). A medicine to numb the area (local anesthetic). A medicine to make you fall asleep (general anesthetic). This is rarely needed. You will lie on your side with your knees bent. The tube will: Have oil or gel put on it (be lubricated). Be inserted into your anus. Be gently eased through all parts of your large intestine. Air will  be sent into your colon to keep it open. This may cause some pressure or cramping. Images will be taken with the camera and will appear on a screen. A small tissue sample may be removed to be looked at under a microscope (biopsy). The tissue may be sent to a lab for testing if any signs of problems are found. If small polyps are found, they may be removed and checked for cancer cells. When the procedure is finished, the tube will be removed. The procedure may vary among health care providers and hospitals. What happens after the procedure? Your blood pressure, heart rate, breathing rate, and blood oxygen level will be monitored until you leave the hospital or clinic. You may have a small amount of blood in your stool. You may pass gas and have mild cramping or bloating in your abdomen. This is caused by the air that was used to open your colon during the exam. Do not drive for 24 hours after the procedure. It is up to  you to get the results of your procedure. Ask your health care provider, or the department that is doing the procedure, when your results will be ready. Summary A colonoscopy is a procedure to look at the entire large intestine. Follow instructions from your health care provider about eating and drinking before the procedure. If you were prescribed an oral bowel prep to clean out your colon, take it as told by your health care provider. During the colonoscopy, a flexible tube with a camera on its end is inserted into the anus and then passed into the other parts of the large intestine. This information is not intended to replace advice given to you by your health care provider. Make sure you discuss any questions you have with your health care provider. Document Revised: 02/24/2019 Document Reviewed: 02/24/2019 Elsevier Patient Education  [place].

## 2021-10-13 ENCOUNTER — Telehealth: Payer: Self-pay

## 2021-10-13 NOTE — Telephone Encounter (Signed)
Scheduled for 10/20/2021

## 2021-10-16 DIAGNOSIS — M2012 Hallux valgus (acquired), left foot: Secondary | ICD-10-CM | POA: Diagnosis not present

## 2021-10-16 DIAGNOSIS — Z981 Arthrodesis status: Secondary | ICD-10-CM | POA: Diagnosis not present

## 2021-10-16 DIAGNOSIS — M79672 Pain in left foot: Secondary | ICD-10-CM | POA: Diagnosis not present

## 2021-10-16 DIAGNOSIS — Z9889 Other specified postprocedural states: Secondary | ICD-10-CM | POA: Diagnosis not present

## 2021-10-17 ENCOUNTER — Other Ambulatory Visit: Payer: Self-pay | Admitting: Family Medicine

## 2021-10-17 DIAGNOSIS — Z1231 Encounter for screening mammogram for malignant neoplasm of breast: Secondary | ICD-10-CM

## 2021-10-20 ENCOUNTER — Other Ambulatory Visit: Payer: Self-pay

## 2021-10-20 ENCOUNTER — Ambulatory Visit (INDEPENDENT_AMBULATORY_CARE_PROVIDER_SITE_OTHER): Payer: BC Managed Care – PPO | Admitting: Gastroenterology

## 2021-10-20 ENCOUNTER — Encounter: Payer: Self-pay | Admitting: Gastroenterology

## 2021-10-20 VITALS — BP 131/72 | HR 65 | Temp 98.2°F | Ht 64.0 in | Wt 153.2 lb

## 2021-10-20 DIAGNOSIS — K625 Hemorrhage of anus and rectum: Secondary | ICD-10-CM | POA: Diagnosis not present

## 2021-10-20 MED ORDER — CLENPIQ 10-3.5-12 MG-GM -GM/160ML PO SOLN: 320.0000 mL | Freq: Once | ORAL | 0 refills | Status: AC

## 2021-10-20 NOTE — Progress Notes (Signed)
?  ?Lynn Darby, MD ?[address]  ?[address]  ?[place], [place] [address]  ?Main: [phone]  ?Fax: [phone] ? ? ? ?Gastroenterology Consultation ? ?Referring Provider:     McLean-Scocuzza, Lynn Mackie * ?Primary Care Physician:  Lynn Haven, MD ?Primary Gastroenterologist:  Dr. Cephas Nelson ?Reason for Consultation:     Rectal bleeding ?      ? HPI:   ?Lynn Nelson is a 46 y.o. female referred by Dr. Caryl Nelson, Lynn Adam, MD  for consultation & management of rectal bleeding.  Patient reports that in December, she had an episode of rectal bleeding, bright red blood, it was on Christmas Eve.  Since then, last night she had diarrheal episodes 4-5 times associated with lower abdominal cramps and bright red blood per rectum.  Patient is worried and would like to undergo colonoscopy given her mother with rectal cancer diagnosed at age 84.  Patient otherwise denies any GI symptoms.  No evidence of anemia. ? ?She drinks alcohol socially ?She does not smoke ? ?NSAIDs: None ? ?Antiplts/Anticoagulants/Anti thrombotics: None ? ?GI Procedures: None ? ?Past Medical History:  ?Diagnosis Date  ? Chicken pox   ? Depression   ? ? ?Past Surgical History:  ?Procedure Laterality Date  ? AUGMENTATION MAMMAPLASTY Bilateral   ? breast agumentation     ? BL  ? BREAST SURGERY  08/17/2002  ? augmentation 2004  ? DILATION AND CURETTAGE OF UTERUS    ? x2  ? EYE SURGERY    ? corrective in 1986  ? left foot surgery  09/02/2021  ? left foot bunion surgery; [facility] Dr. Eulogio Bear FUSION FOOT BONE,MIDTARSAL,1 JT  ? TUBAL LIGATION    ? ? ?Current Outpatient Medications:  ?  Sod Picosulfate-Mag Ox-Cit Acd (CLENPIQ) 10-3.5-12 MG-GM -GM/160ML SOLN, Take 320 mLs by mouth once for 1 dose., Disp: 320 mL, Rfl: 0 ? ? ?Family History  ?Problem Relation Age of Onset  ? Cancer Mother   ?     breast cancer 5 years ago, rectal cancer 14  ? Breast cancer Mother 25  ? Heart disease Father   ? Stroke Maternal Grandmother   ? Diabetes Maternal  Grandmother   ? Cancer Paternal Grandmother   ? Alcohol abuse Paternal Grandmother   ? Cancer Paternal Grandfather   ? Alcohol abuse Paternal Grandfather   ?  ? ?Social History  ? ?Tobacco Use  ? Smoking status: Never  ? Smokeless tobacco: Never  ?Substance Use Topics  ? Alcohol use: Yes  ?  Alcohol/week: 3.0 standard drinks  ?  Types: 3 Glasses of wine per week  ? Drug use: No  ? ? ?Allergies as of 10/20/2021 - Review Complete 10/20/2021  ?Allergen Reaction Noted  ? Penicillins Hives 01/16/2015  ? ? ?Review of Systems:    ?All systems reviewed and negative except where noted in HPI. ? ? Physical Exam:  ?BP 131/72 (BP Location: Left Arm, Patient Position: Sitting, Cuff Size: Normal)   Pulse 65   Temp 98.2 ?F (36.8 ?C) (Oral)   Ht '5\' 4"'$  (1.626 m)   Wt 153 lb 4 oz (69.5 kg) Comment: has boot on  BMI 26.31 kg/m?  ?No LMP recorded. ? ?General:   Alert,  Well-developed, well-nourished, pleasant and cooperative in NAD ?Head:  Normocephalic and atraumatic. ?Eyes:  Sclera clear, no icterus.   Conjunctiva pink. ?Ears:  Normal auditory acuity. ?Nose:  No deformity, discharge, or lesions. ?Mouth:  No deformity or lesions,oropharynx pink & moist. ?Neck:  Supple; no masses or thyromegaly. ?Lungs:  Respirations even and unlabored.  Clear throughout to auscultation.   No wheezes, crackles, or rhonchi. No acute distress. ?Heart:  Regular rate and rhythm; no murmurs, clicks, rubs, or gallops. ?Abdomen:  Normal bowel sounds. Soft, non-tender and non-distended without masses, hepatosplenomegaly or hernias noted.  No guarding or rebound tenderness.   ?Rectal: Not performed ?Msk:  Symmetrical without gross deformities. Good, equal movement & strength bilaterally. ?Pulses:  Normal pulses noted. ?Extremities:  No clubbing or edema.  No cyanosis. ?Neurologic:  Alert and oriented x3;  grossly normal neurologically. ?Skin:  Intact without significant lesions or rashes. No jaundice. ?Lymph Nodes:  No significant cervical  adenopathy. ?Psych:  Alert and cooperative. Normal mood and affect. ? ?Imaging Studies: ?No abdominal imaging ? ?Assessment and Plan:  ? ?[name] is a 46 y.o. pleasant Caucasian female with no significant past medical history is seen in consultation for painless rectal bleeding, bright red blood ? ?Proceed with diagnostic colonoscopy ?Discussed about outpatient hemorrhoid banding based on the colonoscopy results and if her symptoms are persistent ? ? ?Follow up based on the colonoscopy results ? ? ?Lynn Darby, MD ? ?

## 2021-10-21 ENCOUNTER — Encounter: Payer: Self-pay | Admitting: Gastroenterology

## 2021-10-23 ENCOUNTER — Other Ambulatory Visit (INDEPENDENT_AMBULATORY_CARE_PROVIDER_SITE_OTHER): Payer: BC Managed Care – PPO

## 2021-10-23 ENCOUNTER — Other Ambulatory Visit: Payer: Self-pay

## 2021-10-23 DIAGNOSIS — Z13220 Encounter for screening for lipoid disorders: Secondary | ICD-10-CM | POA: Diagnosis not present

## 2021-10-23 DIAGNOSIS — E559 Vitamin D deficiency, unspecified: Secondary | ICD-10-CM

## 2021-10-23 DIAGNOSIS — Z1329 Encounter for screening for other suspected endocrine disorder: Secondary | ICD-10-CM

## 2021-10-23 DIAGNOSIS — Z1389 Encounter for screening for other disorder: Secondary | ICD-10-CM | POA: Diagnosis not present

## 2021-10-23 DIAGNOSIS — Z0000 Encounter for general adult medical examination without abnormal findings: Secondary | ICD-10-CM

## 2021-10-23 LAB — LIPID PANEL
Cholesterol: 210 mg/dL — ABNORMAL HIGH (ref 0–200)
HDL: 70.2 mg/dL (ref >39.00)
LDL Cholesterol: 124 mg/dL — ABNORMAL HIGH (ref 0–99)
NonHDL: 139.87
Total CHOL/HDL Ratio: 3
Triglycerides: 77 mg/dL (ref 0.0–149.0)
VLDL: 15.4 mg/dL (ref 0.0–40.0)

## 2021-10-23 LAB — CBC WITH DIFFERENTIAL/PLATELET
Basophils Absolute: 0 10*3/uL (ref 0.0–0.1)
Basophils Relative: 0.8 % (ref 0.0–3.0)
Eosinophils Absolute: 0.1 10*3/uL (ref 0.0–0.7)
Eosinophils Relative: 1.7 % (ref 0.0–5.0)
HCT: 42.4 % (ref 36.0–46.0)
Hemoglobin: 14.2 g/dL (ref 12.0–15.0)
Lymphocytes Relative: 35.4 % (ref 12.0–46.0)
Lymphs Abs: 2.1 10*3/uL (ref 0.7–4.0)
MCHC: 33.5 g/dL (ref 30.0–36.0)
MCV: 94 fl (ref 78.0–100.0)
Monocytes Absolute: 0.6 10*3/uL (ref 0.1–1.0)
Monocytes Relative: 9.7 % (ref 3.0–12.0)
Neutro Abs: 3 10*3/uL (ref 1.4–7.7)
Neutrophils Relative %: 52.4 % (ref 43.0–77.0)
Platelets: 416 10*3/uL — ABNORMAL HIGH (ref 150.0–400.0)
RBC: 4.51 Mil/uL (ref 3.87–5.11)
RDW: 13.3 % (ref 11.5–15.5)
WBC: 5.8 10*3/uL (ref 4.0–10.5)

## 2021-10-23 LAB — COMPREHENSIVE METABOLIC PANEL
ALT: 17 U/L (ref 0–35)
AST: 16 U/L (ref 0–37)
Albumin: 4.7 g/dL (ref 3.5–5.2)
Alkaline Phosphatase: 58 U/L (ref 39–117)
BUN: 11 mg/dL (ref 6–23)
CO2: 28 mEq/L (ref 19–32)
Calcium: 10 mg/dL (ref 8.4–10.5)
Chloride: 102 mEq/L (ref 96–112)
Creatinine, Ser: 0.78 mg/dL (ref 0.40–1.20)
GFR: 92.73 mL/min (ref >60.00)
Glucose, Bld: 90 mg/dL (ref 70–99)
Potassium: 4.3 mEq/L (ref 3.5–5.1)
Sodium: 137 mEq/L (ref 135–145)
Total Bilirubin: 0.8 mg/dL (ref 0.2–1.2)
Total Protein: 7.1 g/dL (ref 6.0–8.3)

## 2021-10-23 LAB — VITAMIN D 25 HYDROXY (VIT D DEFICIENCY, FRACTURES): VITD: 47.13 ng/mL (ref 30.00–100.00)

## 2021-10-23 LAB — TSH: TSH: 2.39 u[IU]/mL (ref 0.35–5.50)

## 2021-10-24 LAB — URINALYSIS, ROUTINE W REFLEX MICROSCOPIC
Bilirubin Urine: NEGATIVE
Glucose, UA: NEGATIVE
Hgb urine dipstick: NEGATIVE
Ketones, ur: NEGATIVE
Leukocytes,Ua: NEGATIVE
Nitrite: NEGATIVE
Protein, ur: NEGATIVE
Specific Gravity, Urine: 1.003 (ref 1.001–1.035)
pH: 7 (ref 5.0–8.0)

## 2021-10-27 ENCOUNTER — Telehealth: Payer: Self-pay | Admitting: Gastroenterology

## 2021-10-27 MED ORDER — GOLYTELY 236 G PO SOLR: 4000.0000 mL | Freq: Once | ORAL | 0 refills | Status: AC

## 2021-10-27 NOTE — Telephone Encounter (Signed)
Pt left message unable to afford prep for colonoscopy requesting something cheaper would like a call back. ?

## 2021-10-27 NOTE — Telephone Encounter (Signed)
Called patient and informed patient I have sent in the Golytely to the pharmacy. Gave her the instructions on how to do the prep. She verbalized understanding  ?

## 2021-10-28 ENCOUNTER — Ambulatory Visit: Payer: BC Managed Care – PPO | Admitting: Anesthesiology

## 2021-10-28 ENCOUNTER — Encounter
Admission: RE | Disposition: A | Discharge status: Home / Self Care | Payer: Self-pay | Source: Home / Self Care | Attending: Gastroenterology

## 2021-10-28 ENCOUNTER — Ambulatory Visit
Admission: RE | Admit: 2021-10-28 | Discharge: 2021-10-28 | Disposition: A | Discharge status: Home / Self Care | Payer: BC Managed Care – PPO | Attending: Gastroenterology | Admitting: Gastroenterology

## 2021-10-28 ENCOUNTER — Other Ambulatory Visit: Payer: Self-pay

## 2021-10-28 ENCOUNTER — Encounter: Payer: Self-pay | Admitting: Gastroenterology

## 2021-10-28 DIAGNOSIS — K625 Hemorrhage of anus and rectum: Secondary | ICD-10-CM | POA: Insufficient documentation

## 2021-10-28 DIAGNOSIS — K5730 Diverticulosis of large intestine without perforation or abscess without bleeding: Secondary | ICD-10-CM | POA: Insufficient documentation

## 2021-10-28 DIAGNOSIS — D127 Benign neoplasm of rectosigmoid junction: Secondary | ICD-10-CM | POA: Diagnosis not present

## 2021-10-28 DIAGNOSIS — K635 Polyp of colon: Secondary | ICD-10-CM

## 2021-10-28 DIAGNOSIS — Z87891 Personal history of nicotine dependence: Secondary | ICD-10-CM | POA: Diagnosis not present

## 2021-10-28 DIAGNOSIS — K644 Residual hemorrhoidal skin tags: Secondary | ICD-10-CM | POA: Diagnosis not present

## 2021-10-28 HISTORY — DX: Other specified postprocedural states: Z98.890

## 2021-10-28 HISTORY — DX: Family history of other specified conditions: Z84.89

## 2021-10-28 HISTORY — DX: Nausea with vomiting, unspecified: R11.2

## 2021-10-28 HISTORY — DX: Scoliosis, unspecified: M41.9

## 2021-10-28 LAB — POCT PREGNANCY, URINE: Preg Test, Ur: NEGATIVE

## 2021-10-28 SURGERY — COLONOSCOPY WITH PROPOFOL
Anesthesia: General | Site: Rectum

## 2021-10-28 MED ORDER — ACETAMINOPHEN 325 MG PO TABS: 325.0000 mg | ORAL_TABLET | Freq: Once | ORAL | Status: DC

## 2021-10-28 MED ORDER — SODIUM CHLORIDE 0.9 % IV SOLN: INTRAVENOUS | Status: DC

## 2021-10-28 MED ORDER — STERILE WATER FOR IRRIGATION IR SOLN
Status: DC | PRN
  Administered 2021-10-28: 1

## 2021-10-28 MED ORDER — PROPOFOL 10 MG/ML IV BOLUS
INTRAVENOUS | Status: DC | PRN
  Administered 2021-10-28: 20 mg via INTRAVENOUS
  Administered 2021-10-28: 70 mg via INTRAVENOUS
  Administered 2021-10-28: 20 mg via INTRAVENOUS
  Administered 2021-10-28: 30 mg via INTRAVENOUS
  Administered 2021-10-28: 20 mg via INTRAVENOUS
  Administered 2021-10-28: 50 mg via INTRAVENOUS
  Administered 2021-10-28: 60 mg via INTRAVENOUS
  Administered 2021-10-28: 20 mg via INTRAVENOUS

## 2021-10-28 MED ORDER — ACETAMINOPHEN 160 MG/5ML PO SOLN: 325.0000 mg | Freq: Once | ORAL | Status: DC

## 2021-10-28 MED ORDER — STERILE WATER FOR IRRIGATION IR SOLN
Status: DC | PRN
  Administered 2021-10-28: 60 mL

## 2021-10-28 MED ORDER — LACTATED RINGERS IV SOLN: INTRAVENOUS | Status: DC

## 2021-10-28 MED ORDER — LIDOCAINE HCL (CARDIAC) PF 100 MG/5ML IV SOSY
PREFILLED_SYRINGE | INTRAVENOUS | Status: DC | PRN
  Administered 2021-10-28: 60 mg via INTRAVENOUS

## 2021-10-28 SURGICAL SUPPLY — 9 items
GAUZE SPONGE 4X4 12PLY STRL (GAUZE/BANDAGES/DRESSINGS) ×1 IMPLANT
GOWN CVR UNV OPN BCK APRN NK (MISCELLANEOUS) ×6 IMPLANT
GOWN ISOL THUMB LOOP REG UNIV (MISCELLANEOUS) ×6
KIT PRC NS LF DISP ENDO (KITS) ×3 IMPLANT
KIT PROCEDURE OLYMPUS (KITS) ×3
MANIFOLD NEPTUNE II (INSTRUMENTS) ×4 IMPLANT
SNARE COLD EXACTO (MISCELLANEOUS) ×1 IMPLANT
TRAP ETRAP POLY (MISCELLANEOUS) ×1 IMPLANT
WATER STERILE IRR 250ML POUR (IV SOLUTION) ×4 IMPLANT

## 2021-10-28 NOTE — Anesthesia Preprocedure Evaluation (Signed)
Anesthesia Evaluation  ?Patient identified by MRN, date of birth, ID band ?Patient awake ? ? ? ?Reviewed: ?Allergy & Precautions, H&P , NPO status , Patient's Chart, lab work & pertinent test results ? ?History of Anesthesia Complications ?(+) PONV and history of anesthetic complications ? ?Airway ?Mallampati: II ? ?TM Distance: >3 FB ?Neck ROM: full ? ? ? Dental ?no notable dental hx. ? ?  ?Pulmonary ?former smoker,  ?  ?Pulmonary exam normal ?breath sounds clear to auscultation ? ? ? ? ? ? Cardiovascular ?Normal cardiovascular exam ?Rhythm:regular Rate:Normal ? ? ?  ?Neuro/Psych ?PSYCHIATRIC DISORDERS   ? GI/Hepatic ?  ?Endo/Other  ? ? Renal/GU ?  ? ?  ?Musculoskeletal ? ? Abdominal ?  ?Peds ? Hematology ?  ?Anesthesia Other Findings ? ? Reproductive/Obstetrics ? ?  ? ? ? ? ? ? ? ? ? ? ? ? ? ?  ?  ? ? ? ? ? ? ? ? ?Anesthesia Physical ?Anesthesia Plan ? ?ASA: 2 ? ?Anesthesia Plan: General  ? ?Post-op Pain Management: Minimal or no pain anticipated  ? ?Induction: Intravenous ? ?PONV Risk Score and Plan: 4 or greater and Treatment may vary due to age or medical condition, TIVA and Propofol infusion ? ?Airway Management Planned: Natural Airway ? ?Additional Equipment:  ? ?Intra-op Plan:  ? ?Post-operative Plan:  ? ?Informed Consent: I have reviewed the patients History and Physical, chart, labs and discussed the procedure including the risks, benefits and alternatives for the proposed anesthesia with the patient or authorized representative who has indicated his/her understanding and acceptance.  ? ? ? ?Dental Advisory Given ? ?Plan Discussed with: CRNA ? ?Anesthesia Plan Comments:   ? ? ? ? ? ? ?Anesthesia Quick Evaluation ? ?

## 2021-10-28 NOTE — Op Note (Signed)
[facility] ?Gastroenterology ?Patient Name: Lynn Nelson ?Procedure Date: 10/28/2021 9:51 AM ?MRN: [id number] ?Account #: [id number] ?Date of Birth: August 17, 1976 ?Admit Type: Outpatient ?Age: 46 ?Room: [facility] OR ROOM 01 ?Gender: Female ?Note Status: Finalized ?Instrument Name: [id number] ?Procedure:             Colonoscopy ?Indications:           This is the patient's first colonoscopy, Rectal  ?                       bleeding ?Providers:             Lin Landsman MD, MD ?Referring MD:          Angela Adam. Caryl Bis (Referring MD) ?Medicines:             General Anesthesia ?Complications:         No immediate complications. Estimated blood loss: None. ?Procedure:             Pre-Anesthesia Assessment: ?                       - Prior to the procedure, a History and Physical was  ?                       performed, and patient medications and allergies were  ?                       reviewed. The patient is competent. The risks and  ?                       benefits of the procedure and the sedation options and  ?                       risks were discussed with the patient. All questions  ?                       were answered and informed consent was obtained.  ?                       Patient identification and proposed procedure were  ?                       verified by the physician, the nurse, the  ?                       anesthesiologist, the anesthetist and the technician  ?                       in the pre-procedure area in the procedure room in the  ?                       endoscopy suite. Mental Status Examination: alert and  ?                       oriented. Airway Examination: normal oropharyngeal  ?                       airway and neck mobility. Respiratory Examination:  ?  clear to auscultation. CV Examination: normal.  ?                       Prophylactic Antibiotics: The patient does not require  ?                       prophylactic antibiotics. Prior Anticoagulants:  The  ?                       patient has taken no previous anticoagulant or  ?                       antiplatelet agents. ASA Grade Assessment: II - A  ?                       patient with mild systemic disease. After reviewing  ?                       the risks and benefits, the patient was deemed in  ?                       satisfactory condition to undergo the procedure. The  ?                       anesthesia plan was to use general anesthesia.  ?                       Immediately prior to administration of medications,  ?                       the patient was re-assessed for adequacy to receive  ?                       sedatives. The heart rate, respiratory rate, oxygen  ?                       saturations, blood pressure, adequacy of pulmonary  ?                       ventilation, and response to care were monitored  ?                       throughout the procedure. The physical status of the  ?                       patient was re-assessed after the procedure. ?                       After obtaining informed consent, the colonoscope was  ?                       passed under direct vision. Throughout the procedure,  ?                       the patient's blood pressure, pulse, and oxygen  ?                       saturations were monitored continuously. The  ?  Colonoscope was introduced through the anus and  ?                       advanced to the the cecum, identified by appendiceal  ?                       orifice and ileocecal valve. The colonoscopy was  ?                       performed without difficulty. The patient tolerated  ?                       the procedure well. The quality of the bowel  ?                       preparation was good. ?Findings: ?     The perianal and digital rectal examinations were normal. Pertinent  ?     negatives include normal sphincter tone and no palpable rectal lesions. ?     A 4 mm polyp was found in the recto-sigmoid colon. The polyp was  ?      sessile. The polyp was removed with a cold snare. Resection and  ?     retrieval were complete. Estimated blood loss: none. ?     Many diverticula were found in the recto-sigmoid colon and sigmoid colon. ?     Non-bleeding external hemorrhoids were found during retroflexion. The  ?     hemorrhoids were small. ?Impression:            - One 4 mm polyp at the recto-sigmoid colon, removed  ?                       with a cold snare. Resected and retrieved. ?                       - Diverticulosis in the recto-sigmoid colon and in the  ?                       sigmoid colon. ?                       - Non-bleeding external hemorrhoids. ?Recommendation:        - Discharge patient to home (with escort). ?                       - Resume previous diet today. ?                       - Continue present medications. ?                       - Await pathology results. ?                       - Repeat colonoscopy in 7-10 years for surveillance  ?                       based on pathology results. ?Procedure Code(s):     --- Professional --- ?  45385, Colonoscopy, flexible; with removal of  ?                       tumor(s), polyp(s), or other lesion(s) by snare  ?                       technique ?Diagnosis Code(s):     --- Professional --- ?                       K63.5, Polyp of colon ?                       K64.4, Residual hemorrhoidal skin tags ?                       K62.5, Hemorrhage of anus and rectum ?                       K57.30, Diverticulosis of large intestine without  ?                       perforation or abscess without bleeding ?CPT copyright 2019 American Medical Association. All rights reserved. ?The codes documented in this report are preliminary and upon coder review may  ?be revised to meet current compliance requirements. ?Dr. Ulyess Mort ?Sujay Grundman Raeanne Gathers MD, MD ?10/28/2021 10:42:17 AM ?This report has been signed electronically. ?Number of Addenda: 0 ?Note Initiated On: 10/28/2021 9:51  AM ?Scope Withdrawal Time: 0 hours 18 minutes 11 seconds  ?Total Procedure Duration: 0 hours 26 minutes 17 seconds  ?Estimated Blood Loss:  Estimated blood loss: none. ?     [facility] ?

## 2021-10-28 NOTE — Transfer of Care (Signed)
Immediate Anesthesia Transfer of Care Note ? ?Patient: Lynn Nelson ? ?Procedure(s) Performed: COLONOSCOPY WITH BIOPSY (Rectum) ?POLYPECTOMY (Rectum) ? ?Patient Location: PACU ? ?Anesthesia Type: General ? ?Level of Consciousness: awake, alert  and patient cooperative ? ?Airway and Oxygen Therapy: Patient Spontanous Breathing and Patient connected to supplemental oxygen ? ?Post-op Assessment: Post-op Vital signs reviewed, Patient's Cardiovascular Status Stable, Respiratory Function Stable, Patent Airway and No signs of Nausea or vomiting ? ?Post-op Vital Signs: Reviewed and stable ? ?Complications: No notable events documented. ? ?

## 2021-10-28 NOTE — Anesthesia Postprocedure Evaluation (Signed)
Anesthesia Post Note ? ?Patient: Lynn Nelson ? ?Procedure(s) Performed: COLONOSCOPY WITH BIOPSY (Rectum) ?POLYPECTOMY (Rectum) ? ? ?  ?Patient location during evaluation: PACU ?Anesthesia Type: General ?Level of consciousness: awake and alert and oriented ?Pain management: satisfactory to patient ?Vital Signs Assessment: post-procedure vital signs reviewed and stable ?Respiratory status: spontaneous breathing, nonlabored ventilation and respiratory function stable ?Cardiovascular status: blood pressure returned to baseline and stable ?Postop Assessment: Adequate PO intake and No signs of nausea or vomiting ?Anesthetic complications: no ? ? ?No notable events documented. ? ?Raliegh Ip ? ? ? ? ? ?
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
## 2021-10-29 ENCOUNTER — Encounter: Payer: Self-pay | Admitting: Gastroenterology

## 2021-10-29 LAB — SURGICAL PATHOLOGY

## 2021-11-06 ENCOUNTER — Ambulatory Visit (INDEPENDENT_AMBULATORY_CARE_PROVIDER_SITE_OTHER): Payer: BC Managed Care – PPO | Admitting: Gastroenterology

## 2021-11-06 ENCOUNTER — Other Ambulatory Visit: Payer: Self-pay

## 2021-11-06 ENCOUNTER — Encounter: Payer: Self-pay | Admitting: Gastroenterology

## 2021-11-06 VITALS — BP 115/60 | HR 77 | Temp 98.5°F | Ht 64.0 in | Wt 146.1 lb

## 2021-11-06 DIAGNOSIS — K640 First degree hemorrhoids: Secondary | ICD-10-CM | POA: Diagnosis not present

## 2021-11-06 NOTE — Progress Notes (Signed)
?  ?Cephas Darby, MD ?[address]  ?[address]  ?[place], [place] [address]  ?Main: [phone]  ?Fax: [phone] ? ? ? ?Gastroenterology Consultation ? ?Referring Provider:     Leone Haven, MD ?Primary Care Physician:  Leone Haven, MD ?Primary Gastroenterologist:  Dr. Cephas Darby ?Reason for Consultation:     Rectal bleeding, symptomatic hemorrhoids ?      ? HPI:   ?Lynn Nelson is a 46 y.o. female referred by Dr. Caryl Bis, Angela Adam, MD  for consultation & management of rectal bleeding.  Patient reports that in December, she had an episode of rectal bleeding, bright red blood, it was on Christmas Eve.  Since then, last night she had diarrheal episodes 4-5 times associated with lower abdominal cramps and bright red blood per rectum.  Patient is worried and would like to undergo colonoscopy given her mother with rectal cancer diagnosed at age 71.  Patient otherwise denies any GI symptoms.  No evidence of anemia. ? ?Follow-up visit 11/06/2021 ?Patient underwent colonoscopy, found to have 4 mm polyp in the rectosigmoid area, resected, pathology came back as adenoma only.  She is found to have external hemorrhoids.  She is here to undergo hemorrhoid ligation for her symptoms of rectal bleeding, rectal pressure/swelling/discomfort ? ?She drinks alcohol socially ?She does not smoke ? ?NSAIDs: None ? ?Antiplts/Anticoagulants/Anti thrombotics: None ? ?GI Procedures:  ?Colonoscopy 10/28/2021 ?- One 4 mm polyp at the recto-sigmoid colon, removed with a cold snare. Resected and retrieved. ?- Diverticulosis in the recto-sigmoid colon and in the sigmoid colon. ?- Non-bleeding external hemorrhoids. ?DIAGNOSIS:  ?A. COLON POLYP, RECTOSIGMOID; COLD SNARE:  ?- TUBULAR ADENOMA.  ?- NEGATIVE FOR HIGH-GRADE DYSPLASIA AND MALIGNANCY.  ? ?Past Medical History:  ?Diagnosis Date  ? Chicken pox   ? Depression   ? Family history of adverse reaction to anesthesia   ? Sister - Seizure when waking up after dental  procedure  ? PONV (postoperative nausea and vomiting)   ? Scoliosis   ? ? ?Past Surgical History:  ?Procedure Laterality Date  ? AUGMENTATION MAMMAPLASTY Bilateral   ? breast agumentation     ? BL  ? BREAST SURGERY  08/17/2002  ? augmentation 2004  ? COLONOSCOPY WITH PROPOFOL N/A 10/28/2021  ? Procedure: COLONOSCOPY WITH BIOPSY;  Surgeon: Lin Landsman, MD;  Location: [facility];  Service: Endoscopy;  Laterality: N/A;  ? DILATION AND CURETTAGE OF UTERUS    ? x2  ? EYE SURGERY    ? corrective in 1986  ? left foot surgery  09/02/2021  ? left foot bunion surgery; [facility] Dr. Eulogio Bear FUSION FOOT [place] Meek JT  ? POLYPECTOMY  10/28/2021  ? Procedure: POLYPECTOMY;  Surgeon: Lin Landsman, MD;  Location: [facility];  Service: Endoscopy;;  ? TUBAL LIGATION    ? ? ?Current Outpatient Medications:  ?  Multiple Vitamin (MULTIVITAMIN) tablet, Take 1 tablet by mouth daily. (Patient not taking: Reported on 11/06/2021), Disp: , Rfl:  ? ? ?Family History  ?Problem Relation Age of Onset  ? Cancer Mother   ?     breast cancer 5 years ago, rectal cancer 65  ? Breast cancer Mother 43  ? Heart disease Father   ? Stroke Maternal Grandmother   ? Diabetes Maternal Grandmother   ? Cancer Paternal Grandmother   ? Alcohol abuse Paternal Grandmother   ? Cancer Paternal Grandfather   ? Alcohol abuse Paternal Grandfather   ?  ? ?Social History  ? ?  Tobacco Use  ? Smoking status: Former  ?  Types: Cigarettes  ?  Quit date: 2007  ?  Years since quitting: 16.2  ? Smokeless tobacco: Never  ?Vaping Use  ? Vaping Use: Never used  ?Substance Use Topics  ? Alcohol use: Yes  ?  Alcohol/week: 3.0 standard drinks  ?  Types: 3 Glasses of wine per week  ? Drug use: No  ? ? ?Allergies as of 11/06/2021 - Review Complete 11/06/2021  ?Allergen Reaction Noted  ? Penicillins Hives 01/16/2015  ? ? ?Review of Systems:    ?All systems reviewed and negative except where noted in HPI. ? ? Physical Exam:  ?BP 115/60 (BP Location: Left Arm,  Patient Position: Sitting, Cuff Size: Normal)   Pulse 77   Temp 98.5 ?F (36.9 ?C) (Oral)   Ht '5\' 4"'$  (1.626 m)   Wt 146 lb 2 oz (66.3 kg)   LMP 10/14/2021 (Exact Date)   BMI 25.08 kg/m?  ?Patient's last menstrual period was 10/14/2021 (exact date). ? ?General:   Alert,  Well-developed, well-nourished, pleasant and cooperative in NAD ?Head:  Normocephalic and atraumatic. ?Eyes:  Sclera clear, no icterus.   Conjunctiva pink. ?Ears:  Normal auditory acuity. ?Nose:  No deformity, discharge, or lesions. ?Mouth:  No deformity or lesions,oropharynx pink & moist. ?Neck:  Supple; no masses or thyromegaly. ?Lungs:  Respirations even and unlabored.  Clear throughout to auscultation.   No wheezes, crackles, or rhonchi. No acute distress. ?Heart:  Regular rate and rhythm; no murmurs, clicks, rubs, or gallops. ?Abdomen:  Normal bowel sounds. Soft, non-tender and non-distended without masses, hepatosplenomegaly or hernias noted.  No guarding or rebound tenderness.   ?Rectal: Normal perianal exam, nontender digital rectal exam ?Msk:  Symmetrical without gross deformities. Good, equal movement & strength bilaterally. ?Pulses:  Normal pulses noted. ?Extremities:  No clubbing or edema.  No cyanosis. ?Neurologic:  Alert and oriented x3;  grossly normal neurologically. ?Skin:  Intact without significant lesions or rashes. No jaundice. ?Psych:  Alert and cooperative. Normal mood and affect. ? ?Imaging Studies: ?No abdominal imaging ? ?Assessment and Plan:  ? ?Lynn Nelson is a 46 y.o. pleasant Caucasian female with no significant past medical history is seen in consultation for painless rectal bleeding, bright red blood, s/p colonoscopy which revealed external hemorrhoids and a subcentimeter tubular adenoma of the colon ? ?Symptomatic external hemorrhoids, grade 1 ?Discussed about outpatient hemorrhoid ligation, procedure, risks and benefits ?Consent obtained ?Proceed with hemorrhoid ligation today ? ? ?Follow up in 2  weeks ? ? ?Cephas Darby, MD ? ?

## 2021-11-06 NOTE — Progress Notes (Signed)

## 2021-11-26 ENCOUNTER — Ambulatory Visit
Admission: RE | Admit: 2021-11-26 | Discharge: 2021-11-26 | Disposition: A | Discharge status: Home / Self Care | Payer: BC Managed Care – PPO | Source: Ambulatory Visit | Attending: Family Medicine | Admitting: Family Medicine

## 2021-11-26 ENCOUNTER — Encounter: Payer: Self-pay | Admitting: Gastroenterology

## 2021-11-26 ENCOUNTER — Other Ambulatory Visit: Payer: Self-pay | Admitting: Family Medicine

## 2021-11-26 ENCOUNTER — Ambulatory Visit (INDEPENDENT_AMBULATORY_CARE_PROVIDER_SITE_OTHER): Payer: BC Managed Care – PPO | Admitting: Gastroenterology

## 2021-11-26 VITALS — BP 110/72 | HR 71 | Temp 98.9°F | Ht 64.0 in | Wt 147.1 lb

## 2021-11-26 DIAGNOSIS — R928 Other abnormal and inconclusive findings on diagnostic imaging of breast: Secondary | ICD-10-CM

## 2021-11-26 DIAGNOSIS — K640 First degree hemorrhoids: Secondary | ICD-10-CM | POA: Diagnosis not present

## 2021-11-26 DIAGNOSIS — N6489 Other specified disorders of breast: Secondary | ICD-10-CM

## 2021-11-26 DIAGNOSIS — Z1231 Encounter for screening mammogram for malignant neoplasm of breast: Secondary | ICD-10-CM | POA: Diagnosis not present

## 2021-11-26 DIAGNOSIS — K625 Hemorrhage of anus and rectum: Secondary | ICD-10-CM | POA: Diagnosis not present

## 2021-11-26 NOTE — Progress Notes (Signed)

## 2021-11-27 DIAGNOSIS — M2012 Hallux valgus (acquired), left foot: Secondary | ICD-10-CM | POA: Diagnosis not present

## 2021-12-01 DIAGNOSIS — R109 Unspecified abdominal pain: Secondary | ICD-10-CM | POA: Diagnosis not present

## 2021-12-01 DIAGNOSIS — R000 Tachycardia, unspecified: Secondary | ICD-10-CM | POA: Diagnosis not present

## 2021-12-01 DIAGNOSIS — R6883 Chills (without fever): Secondary | ICD-10-CM | POA: Diagnosis not present

## 2021-12-01 DIAGNOSIS — M545 Low back pain, unspecified: Secondary | ICD-10-CM | POA: Diagnosis not present

## 2021-12-01 DIAGNOSIS — R300 Dysuria: Secondary | ICD-10-CM | POA: Diagnosis not present

## 2021-12-01 DIAGNOSIS — N12 Tubulo-interstitial nephritis, not specified as acute or chronic: Secondary | ICD-10-CM | POA: Diagnosis not present

## 2021-12-01 DIAGNOSIS — R110 Nausea: Secondary | ICD-10-CM | POA: Diagnosis not present

## 2021-12-01 DIAGNOSIS — Z880 Allergy status to penicillin: Secondary | ICD-10-CM | POA: Diagnosis not present

## 2021-12-01 DIAGNOSIS — Z7982 Long term (current) use of aspirin: Secondary | ICD-10-CM | POA: Diagnosis not present

## 2021-12-01 DIAGNOSIS — M546 Pain in thoracic spine: Secondary | ICD-10-CM | POA: Diagnosis not present

## 2021-12-02 ENCOUNTER — Ambulatory Visit: Payer: BC Managed Care – PPO | Admitting: Internal Medicine

## 2021-12-09 ENCOUNTER — Ambulatory Visit
Admission: RE | Admit: 2021-12-09 | Discharge: 2021-12-09 | Disposition: A | Discharge status: Home / Self Care | Payer: BC Managed Care – PPO | Source: Ambulatory Visit | Attending: Family Medicine | Admitting: Family Medicine

## 2021-12-09 DIAGNOSIS — N6489 Other specified disorders of breast: Secondary | ICD-10-CM

## 2021-12-09 DIAGNOSIS — R928 Other abnormal and inconclusive findings on diagnostic imaging of breast: Secondary | ICD-10-CM | POA: Diagnosis not present

## 2021-12-09 DIAGNOSIS — R922 Inconclusive mammogram: Secondary | ICD-10-CM | POA: Diagnosis not present

## 2021-12-15 ENCOUNTER — Ambulatory Visit: Payer: BC Managed Care – PPO | Admitting: Gastroenterology

## 2021-12-19 ENCOUNTER — Encounter: Payer: Self-pay | Admitting: Family Medicine

## 2021-12-19 ENCOUNTER — Telehealth: Payer: Self-pay | Admitting: Family Medicine

## 2021-12-19 ENCOUNTER — Ambulatory Visit (INDEPENDENT_AMBULATORY_CARE_PROVIDER_SITE_OTHER): Payer: BC Managed Care – PPO | Admitting: Family Medicine

## 2021-12-19 VITALS — BP 90/60 | HR 70 | Temp 98.1°F | Ht 64.0 in | Wt 144.2 lb

## 2021-12-19 DIAGNOSIS — Z130 Encounter for screening for diseases of the blood and blood-forming organs and certain disorders involving the immune mechanism: Secondary | ICD-10-CM | POA: Diagnosis not present

## 2021-12-19 DIAGNOSIS — Z13220 Encounter for screening for lipoid disorders: Secondary | ICD-10-CM

## 2021-12-19 DIAGNOSIS — Z1329 Encounter for screening for other suspected endocrine disorder: Secondary | ICD-10-CM | POA: Diagnosis not present

## 2021-12-19 DIAGNOSIS — Z0000 Encounter for general adult medical examination without abnormal findings: Secondary | ICD-10-CM | POA: Diagnosis not present

## 2021-12-19 LAB — CBC WITH DIFFERENTIAL/PLATELET
Basophils Absolute: 0.1 10*3/uL (ref 0.0–0.1)
Basophils Relative: 1.5 % (ref 0.0–3.0)
Eosinophils Absolute: 0 10*3/uL (ref 0.0–0.7)
Eosinophils Relative: 0.6 % (ref 0.0–5.0)
HCT: 38.9 % (ref 36.0–46.0)
Hemoglobin: 13 g/dL (ref 12.0–15.0)
Lymphocytes Relative: 28 % (ref 12.0–46.0)
Lymphs Abs: 1.4 10*3/uL (ref 0.7–4.0)
MCHC: 33.5 g/dL (ref 30.0–36.0)
MCV: 92.5 fl (ref 78.0–100.0)
Monocytes Absolute: 0.4 10*3/uL (ref 0.1–1.0)
Monocytes Relative: 8.6 % (ref 3.0–12.0)
Neutro Abs: 3.1 10*3/uL (ref 1.4–7.7)
Neutrophils Relative %: 61.3 % (ref 43.0–77.0)
Platelets: 470 10*3/uL — ABNORMAL HIGH (ref 150.0–400.0)
RBC: 4.2 Mil/uL (ref 3.87–5.11)
RDW: 13.6 % (ref 11.5–15.5)
WBC: 5 10*3/uL (ref 4.0–10.5)

## 2021-12-19 LAB — LIPID PANEL
Cholesterol: 173 mg/dL (ref 0–200)
HDL: 52 mg/dL (ref >39.00)
LDL Cholesterol: 102 mg/dL — ABNORMAL HIGH (ref 0–99)
NonHDL: 121.22
Total CHOL/HDL Ratio: 3
Triglycerides: 97 mg/dL (ref 0.0–149.0)
VLDL: 19.4 mg/dL (ref 0.0–40.0)

## 2021-12-19 LAB — COMPREHENSIVE METABOLIC PANEL
ALT: 16 U/L (ref 0–35)
AST: 17 U/L (ref 0–37)
Albumin: 4.3 g/dL (ref 3.5–5.2)
Alkaline Phosphatase: 54 U/L (ref 39–117)
BUN: 15 mg/dL (ref 6–23)
CO2: 30 mEq/L (ref 19–32)
Calcium: 9.4 mg/dL (ref 8.4–10.5)
Chloride: 101 mEq/L (ref 96–112)
Creatinine, Ser: 0.73 mg/dL (ref 0.40–1.20)
GFR: 100.3 mL/min (ref >60.00)
Glucose, Bld: 87 mg/dL (ref 70–99)
Potassium: 4.1 mEq/L (ref 3.5–5.1)
Sodium: 139 mEq/L (ref 135–145)
Total Bilirubin: 0.3 mg/dL (ref 0.2–1.2)
Total Protein: 6.8 g/dL (ref 6.0–8.3)

## 2021-12-19 LAB — TSH: TSH: 2.53 u[IU]/mL (ref 0.35–5.50)

## 2021-12-19 NOTE — Telephone Encounter (Signed)
Lvm for patient to return a call back to give her a message from provider.  Lynn Nelson,cma  ?

## 2021-12-19 NOTE — Patient Instructions (Signed)
Nice to see you. ?Please continue healthy diet and exercise. ?Please try to get into see a dentist. ?We will let you know what I hear from GI regarding the interval for your colonoscopy. ?
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
## 2021-12-19 NOTE — Telephone Encounter (Signed)
-----   Message from Lin Landsman, MD sent at 12/19/2021 10:14 AM EDT ----- ?Agree, every 5 years  ? ?RV ?----- Message ----- ?From: Leone Haven, MD ?Sent: 12/19/2021   8:51 AM EDT ?To: Lin Landsman, MD ? ?Hi,  ? ?Thanks for seeing Mrs Paterson recently for her rectal bleeding. I wanted to check and see if her family history of rectal cancer would change her colonoscopy interval from 7 years (for the single polyp) to 5 years for her family history. Thanks.  ? ?Torry Istre ? ?

## 2021-12-19 NOTE — Telephone Encounter (Signed)
Please let the patient know that GI advised a colonoscopy every 5 years given her mother's history.  Health maintenance was updated in her chart to reflect this. ?

## 2021-12-19 NOTE — Assessment & Plan Note (Signed)
Physical exam completed.  Encouraged continued healthy diet.  She will remain active through her job.  Cervical cancer screening, breast cancer screening, and colon cancer screening are up-to-date.  We will check with GI regarding the need for colonoscopy at a 5-year interval given her mother's history of rectal cancer.  She declines further COVID vaccines.  I encouraged her to see a dentist.  Lab work as outlined.  She was encouraged to contact us sooner rather than later when she develops urinary tract symptoms. ?

## 2021-12-22 NOTE — Telephone Encounter (Signed)
I called and LVM informing the patient that I sent her a message on mychart.  [name],cma  ?

## 2021-12-24 ENCOUNTER — Other Ambulatory Visit: Payer: Self-pay

## 2021-12-24 DIAGNOSIS — D75839 Thrombocytosis, unspecified: Secondary | ICD-10-CM

## 2022-01-30 ENCOUNTER — Other Ambulatory Visit (INDEPENDENT_AMBULATORY_CARE_PROVIDER_SITE_OTHER): Payer: BC Managed Care – PPO

## 2022-01-30 DIAGNOSIS — D75839 Thrombocytosis, unspecified: Secondary | ICD-10-CM

## 2022-01-30 LAB — CBC WITH DIFFERENTIAL/PLATELET
Basophils Absolute: 0.1 10*3/uL (ref 0.0–0.1)
Basophils Relative: 0.9 % (ref 0.0–3.0)
Eosinophils Absolute: 0.1 10*3/uL (ref 0.0–0.7)
Eosinophils Relative: 1.1 % (ref 0.0–5.0)
HCT: 39.9 % (ref 36.0–46.0)
Hemoglobin: 13.4 g/dL (ref 12.0–15.0)
Lymphocytes Relative: 28.8 % (ref 12.0–46.0)
Lymphs Abs: 1.6 10*3/uL (ref 0.7–4.0)
MCHC: 33.6 g/dL (ref 30.0–36.0)
MCV: 93.2 fl (ref 78.0–100.0)
Monocytes Absolute: 0.6 10*3/uL (ref 0.1–1.0)
Monocytes Relative: 11.1 % (ref 3.0–12.0)
Neutro Abs: 3.3 10*3/uL (ref 1.4–7.7)
Neutrophils Relative %: 58.1 % (ref 43.0–77.0)
Platelets: 358 10*3/uL (ref 150.0–400.0)
RBC: 4.28 Mil/uL (ref 3.87–5.11)
RDW: 14.2 % (ref 11.5–15.5)
WBC: 5.7 10*3/uL (ref 4.0–10.5)

## 2022-02-26 DIAGNOSIS — S93402D Sprain of unspecified ligament of left ankle, subsequent encounter: Secondary | ICD-10-CM | POA: Diagnosis not present

## 2022-02-26 DIAGNOSIS — S92502A Displaced unspecified fracture of left lesser toe(s), initial encounter for closed fracture: Secondary | ICD-10-CM | POA: Diagnosis not present

## 2022-02-26 DIAGNOSIS — M2012 Hallux valgus (acquired), left foot: Secondary | ICD-10-CM | POA: Diagnosis not present

## 2022-02-26 DIAGNOSIS — Z981 Arthrodesis status: Secondary | ICD-10-CM | POA: Diagnosis not present

## 2022-06-18 DIAGNOSIS — S93402D Sprain of unspecified ligament of left ankle, subsequent encounter: Secondary | ICD-10-CM | POA: Diagnosis not present

## 2022-06-18 DIAGNOSIS — S92502A Displaced unspecified fracture of left lesser toe(s), initial encounter for closed fracture: Secondary | ICD-10-CM | POA: Diagnosis not present

## 2022-06-18 DIAGNOSIS — Z09 Encounter for follow-up examination after completed treatment for conditions other than malignant neoplasm: Secondary | ICD-10-CM | POA: Diagnosis not present

## 2022-06-18 DIAGNOSIS — S92502D Displaced unspecified fracture of left lesser toe(s), subsequent encounter for fracture with routine healing: Secondary | ICD-10-CM | POA: Diagnosis not present

## 2022-06-18 DIAGNOSIS — M2012 Hallux valgus (acquired), left foot: Secondary | ICD-10-CM | POA: Diagnosis not present

## 2022-08-28 ENCOUNTER — Ambulatory Visit (INDEPENDENT_AMBULATORY_CARE_PROVIDER_SITE_OTHER): Payer: BC Managed Care – PPO

## 2022-08-28 ENCOUNTER — Encounter: Payer: Self-pay | Admitting: Nurse Practitioner

## 2022-08-28 ENCOUNTER — Ambulatory Visit: Payer: BC Managed Care – PPO | Admitting: Nurse Practitioner

## 2022-08-28 VITALS — BP 114/74 | HR 81 | Temp 97.9°F | Ht 64.0 in | Wt 151.4 lb

## 2022-08-28 DIAGNOSIS — R058 Other specified cough: Secondary | ICD-10-CM | POA: Diagnosis not present

## 2022-08-28 DIAGNOSIS — R059 Cough, unspecified: Secondary | ICD-10-CM | POA: Diagnosis not present

## 2022-08-28 MED ORDER — BENZONATATE 100 MG PO CAPS: 100.0000 mg | ORAL_CAPSULE | Freq: Two times a day (BID) | ORAL | 0 refills | Status: DC | PRN

## 2022-08-28 NOTE — Assessment & Plan Note (Signed)
Started her on benzonatate 100 mg twice a day. Advised her to increase hydration. Chest x-ray ordered. Will call patient with the results.
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
## 2022-08-30 NOTE — Progress Notes (Signed)
X ray is normal.  Please call the pt.

## 2022-09-09 DIAGNOSIS — S92425A Nondisplaced fracture of distal phalanx of left great toe, initial encounter for closed fracture: Secondary | ICD-10-CM | POA: Diagnosis not present

## 2022-09-09 DIAGNOSIS — S92415A Nondisplaced fracture of proximal phalanx of left great toe, initial encounter for closed fracture: Secondary | ICD-10-CM | POA: Diagnosis not present

## 2022-10-21 DIAGNOSIS — S92415D Nondisplaced fracture of proximal phalanx of left great toe, subsequent encounter for fracture with routine healing: Secondary | ICD-10-CM | POA: Diagnosis not present

## 2022-10-21 DIAGNOSIS — M2012 Hallux valgus (acquired), left foot: Secondary | ICD-10-CM | POA: Diagnosis not present

## 2022-10-21 DIAGNOSIS — S92425A Nondisplaced fracture of distal phalanx of left great toe, initial encounter for closed fracture: Secondary | ICD-10-CM | POA: Diagnosis not present

## 2022-11-03 ENCOUNTER — Ambulatory Visit (INDEPENDENT_AMBULATORY_CARE_PROVIDER_SITE_OTHER): Payer: BC Managed Care – PPO | Admitting: Family Medicine

## 2022-11-03 ENCOUNTER — Encounter: Payer: Self-pay | Admitting: Family Medicine

## 2022-11-03 VITALS — BP 126/80 | HR 81 | Temp 97.6°F | Ht 64.0 in | Wt 147.8 lb

## 2022-11-03 DIAGNOSIS — H04123 Dry eye syndrome of bilateral lacrimal glands: Secondary | ICD-10-CM

## 2022-11-03 DIAGNOSIS — H538 Other visual disturbances: Secondary | ICD-10-CM | POA: Insufficient documentation

## 2022-11-03 DIAGNOSIS — M549 Dorsalgia, unspecified: Secondary | ICD-10-CM | POA: Diagnosis not present

## 2022-11-03 DIAGNOSIS — G5603 Carpal tunnel syndrome, bilateral upper limbs: Secondary | ICD-10-CM

## 2022-11-03 DIAGNOSIS — G5600 Carpal tunnel syndrome, unspecified upper limb: Secondary | ICD-10-CM | POA: Insufficient documentation

## 2022-11-03 DIAGNOSIS — R5383 Other fatigue: Secondary | ICD-10-CM | POA: Diagnosis not present

## 2022-11-03 DIAGNOSIS — R002 Palpitations: Secondary | ICD-10-CM | POA: Diagnosis not present

## 2022-11-03 DIAGNOSIS — R3589 Other polyuria: Secondary | ICD-10-CM | POA: Diagnosis not present

## 2022-11-03 DIAGNOSIS — G8929 Other chronic pain: Secondary | ICD-10-CM

## 2022-11-03 DIAGNOSIS — F419 Anxiety disorder, unspecified: Secondary | ICD-10-CM | POA: Insufficient documentation

## 2022-11-03 DIAGNOSIS — M2550 Pain in unspecified joint: Secondary | ICD-10-CM | POA: Diagnosis not present

## 2022-11-03 DIAGNOSIS — F32A Depression, unspecified: Secondary | ICD-10-CM

## 2022-11-03 LAB — POCT URINALYSIS DIPSTICK
Bilirubin, UA: NEGATIVE
Blood, UA: NEGATIVE
Glucose, UA: NEGATIVE
Ketones, UA: NEGATIVE
Leukocytes, UA: NEGATIVE
Nitrite, UA: NEGATIVE
Protein, UA: NEGATIVE
Spec Grav, UA: 1.015 (ref 1.010–1.025)
Urobilinogen, UA: 0.2 E.U./dL
pH, UA: 8.5 — AB (ref 5.0–8.0)

## 2022-11-03 LAB — COMPREHENSIVE METABOLIC PANEL
ALT: 24 U/L (ref 0–35)
AST: 20 U/L (ref 0–37)
Albumin: 4.4 g/dL (ref 3.5–5.2)
Alkaline Phosphatase: 56 U/L (ref 39–117)
BUN: 10 mg/dL (ref 6–23)
CO2: 27 mEq/L (ref 19–32)
Calcium: 9.6 mg/dL (ref 8.4–10.5)
Chloride: 103 mEq/L (ref 96–112)
Creatinine, Ser: 0.74 mg/dL (ref 0.40–1.20)
GFR: 98.07 mL/min (ref >60.00)
Glucose, Bld: 99 mg/dL (ref 70–99)
Potassium: 3.9 mEq/L (ref 3.5–5.1)
Sodium: 138 mEq/L (ref 135–145)
Total Bilirubin: 0.5 mg/dL (ref 0.2–1.2)
Total Protein: 7.3 g/dL (ref 6.0–8.3)

## 2022-11-03 LAB — CBC WITH DIFFERENTIAL/PLATELET
Basophils Absolute: 0 10*3/uL (ref 0.0–0.1)
Basophils Relative: 0.8 % (ref 0.0–3.0)
Eosinophils Absolute: 0 10*3/uL (ref 0.0–0.7)
Eosinophils Relative: 0.8 % (ref 0.0–5.0)
HCT: 42.9 % (ref 36.0–46.0)
Hemoglobin: 14.5 g/dL (ref 12.0–15.0)
Lymphocytes Relative: 24.2 % (ref 12.0–46.0)
Lymphs Abs: 1.3 10*3/uL (ref 0.7–4.0)
MCHC: 33.9 g/dL (ref 30.0–36.0)
MCV: 94.9 fl (ref 78.0–100.0)
Monocytes Absolute: 0.5 10*3/uL (ref 0.1–1.0)
Monocytes Relative: 8.7 % (ref 3.0–12.0)
Neutro Abs: 3.6 10*3/uL (ref 1.4–7.7)
Neutrophils Relative %: 65.5 % (ref 43.0–77.0)
Platelets: 398 10*3/uL (ref 150.0–400.0)
RBC: 4.52 Mil/uL (ref 3.87–5.11)
RDW: 13 % (ref 11.5–15.5)
WBC: 5.5 10*3/uL (ref 4.0–10.5)

## 2022-11-03 LAB — HEMOGLOBIN A1C: Hgb A1c MFr Bld: 5.3 % (ref 4.6–6.5)

## 2022-11-03 LAB — VITAMIN B12: Vitamin B-12: 468 pg/mL (ref 211–911)

## 2022-11-03 LAB — TSH: TSH: 2.65 u[IU]/mL (ref 0.35–5.50)

## 2022-11-03 LAB — VITAMIN D 25 HYDROXY (VIT D DEFICIENCY, FRACTURES): VITD: 27.1 ng/mL — ABNORMAL LOW (ref 30.00–100.00)

## 2022-11-03 NOTE — Assessment & Plan Note (Signed)
Ongoing issue at this time.  Discussed the option of starting medication though the patient defers at this time and wants to see if this would improve as she starts feeling better from her other symptoms.  Discussed we could potentially have her see a psychiatrist in the future as she notes multiple medication side effects with antidepressants in the past.

## 2022-11-03 NOTE — Assessment & Plan Note (Signed)
I encouraged the patient to follow-up with her eye doctor.

## 2022-11-03 NOTE — Assessment & Plan Note (Signed)
We will get lab work to evaluate for rheumatologic causes particularly in the setting of dry eyes and dry mouth.
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
## 2022-11-03 NOTE — Assessment & Plan Note (Signed)
This is an ongoing issue.  This certainly could be related to depression though there could be some other underlying cause.  Will check lab work to evaluate further.

## 2022-11-03 NOTE — Assessment & Plan Note (Addendum)
Patient has chronic intermittent issues of pain in her neck, thoracic spine, and lumbar spine.  She is slightly hyperreflexive today.  Will start with x-rays of her cervical, thoracic, and lumbar spines.  She will return next week for these things as we do not have x-ray available today and she does not want to go to the hospital for the x-rays.  Advised to seek medical attention for any neurological symptoms.

## 2022-11-03 NOTE — Assessment & Plan Note (Addendum)
Intermittent issue.  This could certainly be related to cardiac cause or could be related to anxiety.  EKG completed today.  Refer to cardiology if her lab work does not reveal a cause.

## 2022-11-03 NOTE — Assessment & Plan Note (Signed)
I suspect her hand symptoms are related to carpal tunnel syndrome.  Discussed cock up splints that she notices have not been helpful in the past.  Will consider orthopedic referral once her labs return.

## 2022-11-03 NOTE — Patient Instructions (Addendum)
Nice to see you. If you develop persistent numbness, weakness, worsening headaches, or any new or worsening symptoms please seek medical attention right away. Please call your eye doctor and get into see them for recheck of your vision.

## 2022-11-03 NOTE — Assessment & Plan Note (Addendum)
This could be indicative of diabetes though could also be indicative of a primary urologic issue.  Will check a urinalysis.  We are checking an A1c as well.

## 2022-11-04 ENCOUNTER — Encounter: Payer: Self-pay | Admitting: Family Medicine

## 2022-11-04 DIAGNOSIS — M4125 Other idiopathic scoliosis, thoracolumbar region: Secondary | ICD-10-CM

## 2022-11-04 DIAGNOSIS — Z45819 Encounter for adjustment or removal of unspecified breast implant: Secondary | ICD-10-CM

## 2022-11-05 MED ORDER — MELOXICAM 15 MG PO TABS: 15.0000 mg | ORAL_TABLET | Freq: Every day | ORAL | 0 refills | Status: DC | PRN

## 2022-11-06 ENCOUNTER — Ambulatory Visit: Payer: BC Managed Care – PPO | Admitting: Family Medicine

## 2022-11-06 ENCOUNTER — Other Ambulatory Visit (HOSPITAL_COMMUNITY)
Admission: RE | Admit: 2022-11-06 | Discharge: 2022-11-06 | Disposition: A | Discharge status: Home / Self Care | Payer: BC Managed Care – PPO | Source: Ambulatory Visit | Attending: Family Medicine | Admitting: Family Medicine

## 2022-11-06 ENCOUNTER — Encounter: Payer: Self-pay | Admitting: Family Medicine

## 2022-11-06 VITALS — BP 108/70 | HR 84 | Temp 97.8°F | Ht 64.0 in | Wt 150.2 lb

## 2022-11-06 DIAGNOSIS — Z8041 Family history of malignant neoplasm of ovary: Secondary | ICD-10-CM

## 2022-11-06 DIAGNOSIS — Z124 Encounter for screening for malignant neoplasm of cervix: Secondary | ICD-10-CM

## 2022-11-06 DIAGNOSIS — Z0000 Encounter for general adult medical examination without abnormal findings: Secondary | ICD-10-CM

## 2022-11-06 LAB — RHEUMATOID FACTOR: Rheumatoid fact SerPl-aCnc: <14 IU/mL (ref <14)

## 2022-11-06 LAB — CYCLIC CITRUL PEPTIDE ANTIBODY, IGG: Cyclic Citrullin Peptide Ab: <16 UNITS

## 2022-11-06 LAB — SJOGRENS SYNDROME-A EXTRACTABLE NUCLEAR ANTIBODY: SSA (Ro) (ENA) Antibody, IgG: <1 AI

## 2022-11-06 LAB — SJOGRENS SYNDROME-B EXTRACTABLE NUCLEAR ANTIBODY: SSB (La) (ENA) Antibody, IgG: <1 AI

## 2022-11-06 LAB — ANA: Anti Nuclear Antibody (ANA): NEGATIVE

## 2022-11-06 NOTE — Assessment & Plan Note (Signed)
Physical exam completed.  I encouraged continued healthy diet and exercise.  She will have her mammogram completed in April.  Pap smear completed today.  She declines further COVID vaccinations.  I advised not to drink any more than 1 alcoholic beverage a day.  Will refer to genetics given family history of ovarian cancer.

## 2022-11-06 NOTE — Telephone Encounter (Signed)
Referral placed during visit today.

## 2022-11-06 NOTE — Addendum Note (Signed)
Addended by: Leeanne Rio on: 11/06/2022 02:42 PM   Modules accepted: Orders

## 2022-11-06 NOTE — Addendum Note (Signed)
Addended by: Leone Haven on: 11/06/2022 01:50 PM   Modules accepted: Orders
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
## 2022-11-10 ENCOUNTER — Encounter: Payer: Self-pay | Admitting: Family Medicine

## 2022-11-10 ENCOUNTER — Other Ambulatory Visit: Payer: BC Managed Care – PPO

## 2022-11-10 LAB — CYTOLOGY - PAP
Comment: NEGATIVE
Diagnosis: NEGATIVE
High risk HPV: NEGATIVE

## 2022-11-11 ENCOUNTER — Ambulatory Visit (INDEPENDENT_AMBULATORY_CARE_PROVIDER_SITE_OTHER): Payer: BC Managed Care – PPO

## 2022-11-11 ENCOUNTER — Other Ambulatory Visit: Payer: BC Managed Care – PPO

## 2022-11-11 DIAGNOSIS — M549 Dorsalgia, unspecified: Secondary | ICD-10-CM | POA: Diagnosis not present

## 2022-11-11 DIAGNOSIS — M47816 Spondylosis without myelopathy or radiculopathy, lumbar region: Secondary | ICD-10-CM | POA: Diagnosis not present

## 2022-11-11 DIAGNOSIS — M40292 Other kyphosis, cervical region: Secondary | ICD-10-CM | POA: Diagnosis not present

## 2022-11-11 DIAGNOSIS — M546 Pain in thoracic spine: Secondary | ICD-10-CM | POA: Diagnosis not present

## 2022-11-11 DIAGNOSIS — M542 Cervicalgia: Secondary | ICD-10-CM | POA: Diagnosis not present

## 2022-11-12 ENCOUNTER — Telehealth: Payer: Self-pay

## 2022-11-12 NOTE — Telephone Encounter (Signed)
-----   Message from Leone Haven, MD sent at 11/12/2022  8:56 AM EDT ----- Please let the patient know that her x-rays revealed scoliosis of the spine. I would like to refer her to a spine specialist to get there input on treatment options. I can place the referral once you speak with her.

## 2022-11-12 NOTE — Telephone Encounter (Signed)
LMTCB regarding xray results. 

## 2022-11-17 ENCOUNTER — Telehealth: Payer: Self-pay

## 2022-11-17 DIAGNOSIS — N644 Mastodynia: Secondary | ICD-10-CM

## 2022-11-17 NOTE — Telephone Encounter (Signed)
Order placed

## 2022-11-17 NOTE — Telephone Encounter (Signed)
noted 

## 2022-11-24 ENCOUNTER — Ambulatory Visit
Admission: RE | Admit: 2022-11-24 | Discharge: 2022-11-24 | Disposition: A | Discharge status: Home / Self Care | Payer: BC Managed Care – PPO | Source: Ambulatory Visit | Attending: Family Medicine | Admitting: Family Medicine

## 2022-11-24 ENCOUNTER — Other Ambulatory Visit: Payer: Self-pay | Admitting: Family Medicine

## 2022-11-24 DIAGNOSIS — N644 Mastodynia: Secondary | ICD-10-CM

## 2022-11-24 DIAGNOSIS — R922 Inconclusive mammogram: Secondary | ICD-10-CM | POA: Diagnosis not present

## 2022-12-02 ENCOUNTER — Other Ambulatory Visit: Payer: Self-pay | Admitting: Family Medicine

## 2022-12-02 NOTE — Telephone Encounter (Signed)
Was this helpful for the patient?  Can you please call her and find out if she actually needs to refill?

## 2022-12-03 ENCOUNTER — Inpatient Hospital Stay: Payer: BC Managed Care – PPO

## 2022-12-03 ENCOUNTER — Encounter: Payer: Self-pay | Admitting: Licensed Clinical Social Worker

## 2022-12-03 ENCOUNTER — Inpatient Hospital Stay: Payer: BC Managed Care – PPO | Attending: Oncology | Admitting: Licensed Clinical Social Worker

## 2022-12-03 DIAGNOSIS — Z800 Family history of malignant neoplasm of digestive organs: Secondary | ICD-10-CM

## 2022-12-03 DIAGNOSIS — Z8041 Family history of malignant neoplasm of ovary: Secondary | ICD-10-CM

## 2022-12-03 DIAGNOSIS — Z803 Family history of malignant neoplasm of breast: Secondary | ICD-10-CM | POA: Diagnosis not present

## 2022-12-03 NOTE — Progress Notes (Signed)
REFERRING PROVIDER: Glori Luis, MD [address] [place],  [place] [address]  PRIMARY PROVIDER:  Glori Luis, MD  PRIMARY REASON FOR VISIT:  1. Family history of breast cancer   2. Family history of rectal cancer   3. Family history of ovarian cancer      HISTORY OF PRESENT ILLNESS:   Ms. Bellissimo, a 47 y.o. female, was seen for a [place] cancer genetics consultation at the request of Dr. Birdie Sons due to a family history of cancer.  Ms. Cifuentes presents to clinic today to discuss the possibility of a hereditary predisposition to cancer, genetic testing, and to further clarify her future cancer risks, as well as potential cancer risks for family members.    CANCER HISTORY:  Ms. Pittmon is a 47 y.o. female with no personal history of cancer.    RISK FACTORS:  Menarche was at age 19-16.  First live birth: [birth year]. Ovaries intact: yes.  Hysterectomy: no.  Menopausal status: perimenopausal. Colonoscopy: yes; normal. Mammogram within the last year: yes. Number of breast biopsies: 0. Up to date with pelvic exams: yes.  Past Medical History:  Diagnosis Date   Chicken pox    Depression    Family history of adverse reaction to anesthesia    Sister - Seizure when waking up after dental procedure   PONV (postoperative nausea and vomiting)    Scoliosis     Past Surgical History:  Procedure Laterality Date   AUGMENTATION MAMMAPLASTY Bilateral    breast agumentation      BL   BREAST SURGERY  08/17/2002   augmentation 2004   COLONOSCOPY WITH PROPOFOL N/A 10/28/2021   Procedure: COLONOSCOPY WITH BIOPSY;  Surgeon: Toney Reil, MD;  Location: [facility];  Service: Endoscopy;  Laterality: N/A;   DILATION AND CURETTAGE OF UTERUS     x2   EYE SURGERY     corrective in 1986   left foot surgery  09/02/2021   left foot bunion surgery; [facility] Dr. Brooke Dare FUSION FOOT BONE,MIDTARSAL,1 JT   POLYPECTOMY  10/28/2021   Procedure: POLYPECTOMY;  Surgeon: Toney Reil, MD;  [facility];  Service: Endoscopy;;   TUBAL LIGATION      FAMILY HISTORY:  We obtained a detailed, 4-generation family history.  Significant diagnoses are listed below: Family History  Problem Relation Age of Onset   Breast cancer Mother 56       neg GT ~2014   Rectal cancer Mother 21   Heart disease Father    Prostate cancer Father    Stroke Maternal Grandmother    Diabetes Maternal Grandmother    Alcohol abuse Paternal Grandmother    Cancer Paternal Grandfather        unk type   Alcohol abuse Paternal Grandfather    Ovarian cancer Other    Ms. Coppock has 2 sons, 15 and 9. She has 2 sisters.   Ms. Mcgaughy mother had breast cancer at 65 and reportedly had negative genetic testing at that time (~2014) and then rectal cancer at 14, she is living at 78. A maternal great aunt had ovarian cancer. No other known cancers on this side of the family.  Ms. Osuch father had prostate cancer, he is living at 43. Paternal grandfather had cancer, unknown type. No other known cancers on this side of the family.  Ms. Molesky is aware of previous family history of genetic testing for hereditary cancer risks. There is no reported Ashkenazi Jewish ancestry. There  is no known consanguinity.    GENETIC COUNSELING ASSESSMENT: Ms. Fowers is a 47 y.o. female with a family history of breast and ovarian which is somewhat suggestive of a hereditary cancer syndrome and predisposition to cancer. We, therefore, discussed and recommended the following at today's visit.   DISCUSSION: We discussed that approximately 10% of breast cancer is hereditary. Most cases of hereditary breast/ovarian cancer are associated with BRCA1/BRCA2 genes, although there are other genes associated with hereditary cancer as well. Cancers and risks are gene specific. Since her mother had genetic testing in 2014, it's possible there is something hereditary that was missed by testing at that time that  we could now pick up. Patient's mother is not available for further testing. We discussed that testing is beneficial for several reasons including knowing about cancer risks, identifying potential screening and risk-reduction options that may be appropriate, and to understand if other family members could be at risk for cancer and allow them to undergo genetic testing.   We reviewed the characteristics, features and inheritance patterns of hereditary cancer syndromes. We also discussed genetic testing, including the appropriate family members to test, the process of testing, insurance coverage and turn-around-time for results. We discussed the implications of a negative, positive and/or variant of uncertain significant result. We recommended Ms. Dooly pursue genetic testing for the [facility] CancerNext-Expanded+RNA gene panel.   Based on Ms. Rivero's family history of cancer, she meets medical criteria for genetic testing. Despite that she meets criteria, she may still have an out of pocket cost. We discussed that if her out of pocket cost for testing is over $100, the laboratory will call and confirm whether she wants to proceed with testing.  If the out of pocket cost of testing is less than $100 she will be billed by the genetic testing laboratory.   PLAN: After considering the risks, benefits, and limitations, Ms. Cadotte provided informed consent to pursue genetic testing and the blood sample was sent to [facility] for analysis of the CancerNext-Expanded+RNA. Results should be available within approximately 2-3 weeks' time, at which point they will be disclosed by telephone to Ms. Crunkleton, as will any additional recommendations warranted by these results. Ms. Tennison will receive a summary of her genetic counseling visit and a copy of her results once available. This information will also be available in Epic.   Ms. Duffin questions were answered to her satisfaction today. Our contact information  was provided should additional questions or concerns arise. Thank you for the referral and allowing [place] to share in the care of your patient.   Lacy Duverney, MS, [facility] Genetic Counselor [email or web address] Phone: [phone]  The patient was seen for a total of 25 minutes in face-to-face genetic counseling.  Dr. Orlie Dakin was available for discussion regarding this case.   _______________________________________________________________________ For Office Staff:  Number of people involved in session: 2 Was an Intern/ student involved with case: yes

## 2022-12-09 ENCOUNTER — Ambulatory Visit (INDEPENDENT_AMBULATORY_CARE_PROVIDER_SITE_OTHER): Payer: BC Managed Care – PPO | Admitting: Plastic Surgery

## 2022-12-09 ENCOUNTER — Encounter: Payer: Self-pay | Admitting: Plastic Surgery

## 2022-12-09 VITALS — BP 115/76 | HR 83 | Ht 64.0 in | Wt 153.6 lb

## 2022-12-09 DIAGNOSIS — R293 Abnormal posture: Secondary | ICD-10-CM | POA: Diagnosis not present

## 2022-12-09 DIAGNOSIS — M546 Pain in thoracic spine: Secondary | ICD-10-CM | POA: Diagnosis not present

## 2022-12-09 DIAGNOSIS — M542 Cervicalgia: Secondary | ICD-10-CM | POA: Diagnosis not present

## 2022-12-09 DIAGNOSIS — Z9882 Breast implant status: Secondary | ICD-10-CM | POA: Diagnosis not present
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
## 2022-12-14 NOTE — Telephone Encounter (Signed)
Pt returned [facility] CMA call. Note below from provider was read to pt. As per pt, she does not need med refill below.

## 2022-12-14 NOTE — Telephone Encounter (Signed)
Unable to reach patient. Left voicemail to return call to our office.   

## 2022-12-21 ENCOUNTER — Ambulatory Visit (INDEPENDENT_AMBULATORY_CARE_PROVIDER_SITE_OTHER): Payer: BC Managed Care – PPO | Admitting: Family Medicine

## 2022-12-21 ENCOUNTER — Encounter: Payer: Self-pay | Admitting: Family Medicine

## 2022-12-21 VITALS — BP 114/74 | HR 73 | Temp 98.7°F | Ht 64.0 in | Wt 150.6 lb

## 2022-12-21 DIAGNOSIS — M542 Cervicalgia: Secondary | ICD-10-CM

## 2022-12-21 DIAGNOSIS — M4120 Other idiopathic scoliosis, site unspecified: Secondary | ICD-10-CM

## 2022-12-21 DIAGNOSIS — H04123 Dry eye syndrome of bilateral lacrimal glands: Secondary | ICD-10-CM | POA: Diagnosis not present

## 2022-12-21 DIAGNOSIS — N951 Menopausal and female climacteric states: Secondary | ICD-10-CM

## 2022-12-21 DIAGNOSIS — M419 Scoliosis, unspecified: Secondary | ICD-10-CM | POA: Insufficient documentation

## 2022-12-21 DIAGNOSIS — R002 Palpitations: Secondary | ICD-10-CM

## 2022-12-21 DIAGNOSIS — G8929 Other chronic pain: Secondary | ICD-10-CM

## 2022-12-21 NOTE — Assessment & Plan Note (Signed)
Patient with chronic neck and back pain.  She will continue to see plastic surgery to consider breast implant removal.  Labs for underlying rheumatologic causes were negative.

## 2022-12-21 NOTE — Progress Notes (Signed)
Marikay Alar, MD Phone: [phone]  Lynn Nelson is a 47 y.o. female who presents today for f/u.  Chronic back and neck pain: Patient saw plastic surgery to discuss removal of her breast implants.  They plan on having these removed though they are waiting on insurance approval.  She has not heard from orthopedics regarding the referral for her scoliosis.  Dry eyes: Chronic issue.  Prior lab work was unrevealing for a cause.  She saw her optometrist and they reported she had severe evaporation of the first layer on her eyes.  They started her on eyedrops and omega-3's and that has helped some.  She is using a heating pad for her eyes which is helpful as well.  She follows up with them in July and notes they will consider prescription medication at that time if needed.  Perimenopause: She notes the optometrist suggested that she may be going through perimenopause.  They noted that this could account for a number of her symptoms.  She still has menstrual cycles monthly though the timing is slightly off at times.  Also notes heavier cycles than in the past.  Patient notes intermittent hot flashes for some time now.  Social History   Tobacco Use  Smoking Status Former   Types: Cigarettes   Quit date: 2007   Years since quitting: 17.3  Smokeless Tobacco Never    Current Outpatient Medications on File Prior to Visit  Medication Sig Dispense Refill   Collagen-Vitamin C-Biotin (COLLAGEN 1500/C PO) Take by mouth.     Multiple Vitamins-Calcium (DAILY VITAMINS FOR WOMEN PO) Take by mouth.     No current facility-administered medications on file prior to visit.     ROS see history of present illness  Objective  Physical Exam Vitals:   12/21/22 0930  BP: 114/74  Pulse: 73  Temp: 98.7 F (37.1 C)  SpO2: 98%    BP Readings from Last 3 Encounters:  12/21/22 114/74  12/09/22 115/76  11/06/22 108/70   Wt Readings from Last 3 Encounters:  12/21/22 150 lb 9.6 oz (68.3 kg)   12/09/22 153 lb 9.6 oz (69.7 kg)  11/06/22 150 lb 3.2 oz (68.1 kg)    Physical Exam Constitutional:      General: She is not in acute distress.    Appearance: She is not diaphoretic.  Cardiovascular:     Rate and Rhythm: Normal rate and regular rhythm.     Heart sounds: Normal heart sounds.  Pulmonary:     Effort: Pulmonary effort is normal.     Breath sounds: Normal breath sounds.  Skin:    General: Skin is warm and dry.  Neurological:     Mental Status: She is alert.      Assessment/Plan: Please see individual problem list.  Palpitations  Chronic joint pain Assessment & Plan: Patient with chronic neck and back pain.  She will continue to see plastic surgery to consider breast implant removal.  Labs for underlying rheumatologic causes were negative.   Dry eyes Assessment & Plan: Chronic issue.  Negative lab work previously.  She can continue omega-3 and eyedrops.  She will continue to see her optometrist.   Perimenopause Assessment & Plan: Chronic issue.  Possibly contributing to a number of her prior symptoms.  She requests referral to GYN.  Referral placed.  Orders: -     Ambulatory referral to Gynecology  Other idiopathic scoliosis, unspecified spinal region Assessment & Plan: Chronic issue.  Likely contributing to her back pain.  Prior referral to orthopedics placed.  We will check on that referral.      Return in about 6 months (around 06/23/2023).   Marikay Alar, MD [facility]

## 2022-12-21 NOTE — Assessment & Plan Note (Signed)
Chronic issue.  Likely contributing to her back pain.  Prior referral to orthopedics placed.  We will check on that referral.

## 2022-12-21 NOTE — Assessment & Plan Note (Signed)
Chronic issue.  Negative lab work previously.  She can continue omega-3 and eyedrops.  She will continue to see her optometrist.

## 2022-12-21 NOTE — Assessment & Plan Note (Signed)
Chronic issue.  Possibly contributing to a number of her prior symptoms.  She requests referral to GYN.  Referral placed.

## 2022-12-29 ENCOUNTER — Telehealth: Payer: Self-pay | Admitting: Licensed Clinical Social Worker

## 2022-12-31 ENCOUNTER — Encounter: Payer: Self-pay | Admitting: Family Medicine

## 2022-12-31 ENCOUNTER — Other Ambulatory Visit: Payer: Self-pay

## 2022-12-31 ENCOUNTER — Ambulatory Visit: Payer: Self-pay | Admitting: Licensed Clinical Social Worker

## 2022-12-31 ENCOUNTER — Encounter: Payer: Self-pay | Admitting: Licensed Clinical Social Worker

## 2022-12-31 DIAGNOSIS — N951 Menopausal and female climacteric states: Secondary | ICD-10-CM

## 2022-12-31 DIAGNOSIS — Z1379 Encounter for other screening for genetic and chromosomal anomalies: Secondary | ICD-10-CM

## 2022-12-31 NOTE — Telephone Encounter (Signed)
I contacted Lynn Nelson to discuss her genetic testing results. 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 The remainder of testing was negative/normal. Detailed clinic note to follow.   The test report has been scanned into EPIC and is located under the Molecular Pathology section of the Results Review tab.  A portion of the result report is included below for reference.      Lacy Duverney, MS, [facility] Genetic Counselor [email or web address] Phone: [phone]

## 2022-12-31 NOTE — Telephone Encounter (Signed)
error 

## 2022-12-31 NOTE — Progress Notes (Signed)
HPI:   Ms. Burling was previously seen in the [facility] due to a family history of cancer and concerns regarding a hereditary predisposition to cancer. Please refer to our prior cancer genetics clinic note for more information regarding our discussion, assessment and recommendations, at the time. Ms. Breitenstein recent genetic test results were disclosed to her, as were recommendations warranted by these results. These results and recommendations are discussed in more detail below.  CANCER HISTORY:  Oncology History   No history exists.    FAMILY HISTORY:  We obtained a detailed, 4-generation family history.  Significant diagnoses are listed below: Family History  Problem Relation Age of Onset   Breast cancer Mother 55       neg GT ~2014   Rectal cancer Mother 67   Heart disease Father    Prostate cancer Father    Stroke Maternal Grandmother    Diabetes Maternal Grandmother    Alcohol abuse Paternal Grandmother    Cancer Paternal Grandfather        unk type   Alcohol abuse Paternal Grandfather    Ovarian cancer Other    Ms. Hammack has 2 sons, 15 and 9. She has 2 sisters.    Ms. Rocio mother had breast cancer at 18 and reportedly had negative genetic testing at that time (~2014) and then rectal cancer at 98, she is living at 76. A maternal great aunt had ovarian cancer. No other known cancers on this side of the family.   Ms. Ordonez father had prostate cancer, he is living at 30. Paternal grandfather had cancer, unknown type. No other known cancers on this side of the family.   Ms. Snitker is aware of previous family history of genetic testing for hereditary cancer risks. There is no reported Ashkenazi Jewish ancestry. There is no known consanguinity.    GENETIC TEST RESULTS:  The [employer] CancerNext-Expanded+RNA Panel found a single, likely pathogenic variant in HOXB13 called p.G84E (c.251G>A). The remainder of testing was negative/normal.  The  CancerNext-Expanded + RNAinsight gene panel offered by [employer] and includes sequencing and rearrangement analysis for the following 71 genes: AIP, ALK, APC*, ATM*, AXIN2, BAP1, BARD1, BMPR1A, BRCA1*, BRCA2*, BRIP1*, CDC73, CDH1*,CDK4, CDKN1B, CDKN2A, CHEK2*, CTNNA1, DICER1, FH, FLCN, KIF1B, LZTR1, MAX, MEN1, MET, MLH1*, MSH2*, MSH3, MSH6*, MUTYH*, NF1*, NF2, NTHL1, PALB2*, PHOX2B, PMS2*, POT1, PRKAR1A, PTCH1, PTEN*, RAD51C*, RAD51D*,RB1, RET, SDHA, SDHAF2, SDHB, SDHC, SDHD, SMAD4, SMARCA4, SMARCB1, SMARCE1, STK11, SUFU, TMEM127, TP53*,TSC1, TSC2, VHL; EGFR, EGLN1, HOXB13, KIT, MITF, PDGFRA, POLD1 and POLE (sequencing only); EPCAM and GREM1 (deletion/duplication only).   The test report has been scanned into EPIC and is located under the Molecular Pathology section of the Results Review tab.  A portion of the result report is included below for reference. Genetic testing reported out on 12/28/2022.     This result does not explain the family history of breast/ovarian cancer, possible explanations for the cancer in the family may include: There may be no hereditary risk for cancer in the family. The cancers in Ms. Mccarrick and/or her family may be sporadic/familial or due to other genetic and environmental factors. There may be a gene mutation in one of these genes that current testing methods cannot detect but that chance is small. There could be another gene that has not yet been discovered, or that we have not yet tested, that is responsible for the cancer diagnoses in the family.  It is also possible there is a hereditary cause for the cancer  in the family that Ms. Straley did not inherit.  Therefore, it is important to remain in touch with cancer genetics in the future so that we can continue to offer Ms. Blincoe the most up to date genetic testing.   ADDITIONAL GENETIC TESTING:  We discussed with Ms. Ryks that her genetic testing was fairly extensive.  If there are additional relevant genes  identified to increase cancer risk that can be analyzed in the future, we would be happy to discuss and coordinate this testing at that time.    HOXB13 CANCER RISKS & RECOMMENDATIONS:  At this time, this variant will not affect Ms. Stallman's management as there are no female cancer risks currently associated with this gene. Numerous studies have shown that the c.251G>A (p.Gly84Glu) variant in HOXB13, also known as G84E, is associated with an increased risk of prostate cancer (PMID: 60454098, 11914782, 95621308, 65784696, 29528413, 24401027, 25366440, 34742595, 63875643, 32951884, 16606301). This variant is associated with earlier-onset diagnosis (<55 years) and individuals with this variant are more likely to have a positive family history of prostate cancer (PMID: 60109323, 55732202, 54270623, 76283151, 76160737, 10626948).  Large case control studies and meta-analysis across studies demonstrated that men with this variant have approximately a 33-60% lifetime risk of prostate cancer compared to the general population risk of 12% (PMID: 54627035, 00938182, 99371696, 78938101, 75102585). An individual with this variant will not necessarily develop cancer in his lifetime, but the risk is increased over the general population risk. There may also be an increased risk for other cancer types, however, the current evidence is limited and conflicting (PMID: 27782423, 53614431, 54008676, 19509326, 71245809).  Management:  The National Comprehensive Cancer Network (NCCN) Prostate Cancer Early Detection Guidelines (Version 2.2021) recommend the following prostate cancer screening for individuals with the HOXB13 G84E variant:  Annual PSA screening at age 11 years or 10 years prior to the youngest prostate cancer diagnosed in the family. Strongly consider baseline digital rectal examination.   Ms. Grivas children are have a 50% chance to have inherited this mutation. However, they are relatively young and this  will not be of any consequence to them for several years. We do not test children because there is no risk to them until they are adults. We recommend they have genetic counseling and testing by the time they are in their early 20s.    At this time, we do not feel that Ms. Perkins is at a higher risk for cancer due to this genetic change.  However, her female relatives are at risk for developing prostate cancer, especially if they have inherited this mutation.  If female relatives are identified as having this mutation, they should follow the management outlined above.  CANCER SCREENING RECOMMENDATIONS:  An individual's cancer risk and medical management are not determined by genetic test results alone. Overall cancer risk assessment incorporates additional factors, including personal medical history, family history, and any available genetic information that may result in a personalized plan for cancer prevention and surveillance. Therefore, it is recommended she continue to follow the cancer management and screening guidelines provided by her  primary healthcare provider.  Based on the reported personal and family history, specific cancer screenings for Ms. MIKAYLA MEZO and her family include:  Breast Cancer Screening:   Based on Ms. Osei's personal and family history of cancer as well as her genetic test results, risk model Rockne Menghini was used to estimate her risk of developing breast cancer. This estimates her lifetime risk of developing breast cancer  to be approximately 16%.  The patient's lifetime breast cancer risk is a preliminary estimate based on available information using one of several models endorsed by the [employer] (NCCN). The NCCN recommends consideration of breast MRI screening as an adjunct to mammography for patients at high risk (defined as 20% or greater lifetime risk).  This risk estimate can change over time and may be repeated to reflect new  information in her personal or family history in the future.    RECOMMENDATIONS FOR FAMILY MEMBERS:   As mentioned above, relatives should consider testing for the HOXB13 mutation. Individuals in this family might be at some increased risk of developing cancer, over the general population risk, due to the family history of cancer.  Individuals in the family should notify their providers of the family history of cancer. We recommend women in this family have a yearly mammogram beginning at age 58, or 23 years younger than the earliest onset of cancer, an annual clinical breast exam, and perform monthly breast self-exams.  Family members should have colonoscopies by at age 38, or earlier, as recommended by their providers. Other members of the family may still carry a pathogenic variant in one of these genes that Ms. Benney did not inherit. Based on the family history, we recommend her mother, who was diagnosed with breast cancer at age 73, have genetic counseling and testing. Ms. Ontiveros will let us know if we can be of any assistance in coordinating genetic counseling and/or testing for this family member.     FOLLOW-UP:  Lastly, we discussed with Ms. Blankenship that cancer genetics is a rapidly advancing field and it is possible that new genetic tests will be appropriate for her and/or her family members in the future. We encouraged her to remain in contact with cancer genetics on an annual basis so we can update her personal and family histories and let her know of advances in cancer genetics that may benefit this family.   Our contact number was provided. Ms. Zabinski questions were answered to her satisfaction, and she knows she is welcome to call us at anytime with additional questions or concerns.    Lacy Duverney, MS, [facility] Genetic Counselor [email or web address] Phone: [phone]

## 2023-01-08 ENCOUNTER — Telehealth: Payer: Self-pay | Admitting: Plastic Surgery

## 2023-01-08 NOTE — Telephone Encounter (Signed)
Pending Ref# [id number] for BCBS for Implant Removal.

## 2023-01-13 ENCOUNTER — Telehealth: Payer: Self-pay | Admitting: Plastic Surgery

## 2023-01-13 NOTE — Telephone Encounter (Signed)
No Auth Required for CPT Code: 16109 decision entered on 01/08/23 at 15:51 by GIBO-KIBO,GINDO:

## 2023-01-22 ENCOUNTER — Telehealth: Payer: Self-pay | Admitting: *Deleted

## 2023-01-22 NOTE — Telephone Encounter (Signed)
Lvm to schedule surgery

## 2023-01-26 NOTE — Telephone Encounter (Signed)
Patient called and wanted to know about her surgery schedule.  Best contact is [phone]

## 2023-01-28 ENCOUNTER — Telehealth: Payer: Self-pay | Admitting: *Deleted

## 2023-01-28 NOTE — Telephone Encounter (Signed)
LVM to schedule surgery

## 2023-02-01 DIAGNOSIS — R232 Flushing: Secondary | ICD-10-CM | POA: Diagnosis not present

## 2023-02-01 DIAGNOSIS — N951 Menopausal and female climacteric states: Secondary | ICD-10-CM | POA: Diagnosis not present

## 2023-02-01 DIAGNOSIS — H04123 Dry eye syndrome of bilateral lacrimal glands: Secondary | ICD-10-CM | POA: Diagnosis not present

## 2023-02-01 DIAGNOSIS — R5383 Other fatigue: Secondary | ICD-10-CM | POA: Diagnosis not present

## 2023-02-03 ENCOUNTER — Ambulatory Visit (INDEPENDENT_AMBULATORY_CARE_PROVIDER_SITE_OTHER): Payer: BC Managed Care – PPO | Admitting: Student

## 2023-02-03 VITALS — BP 115/75 | HR 75

## 2023-02-03 DIAGNOSIS — Z9882 Breast implant status: Secondary | ICD-10-CM

## 2023-02-03 MED ORDER — ONDANSETRON HCL 4 MG PO TABS: 4.0000 mg | ORAL_TABLET | Freq: Three times a day (TID) | ORAL | 0 refills | Status: DC | PRN

## 2023-02-03 MED ORDER — HYDROCODONE-ACETAMINOPHEN 5-325 MG PO TABS: 1.0000 | ORAL_TABLET | Freq: Four times a day (QID) | ORAL | 0 refills | Status: DC | PRN
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
## 2023-02-17 ENCOUNTER — Other Ambulatory Visit: Payer: Self-pay

## 2023-02-17 ENCOUNTER — Encounter (HOSPITAL_BASED_OUTPATIENT_CLINIC_OR_DEPARTMENT_OTHER): Payer: Self-pay | Admitting: Plastic Surgery

## 2023-02-25 NOTE — Anesthesia Preprocedure Evaluation (Addendum)
Anesthesia Evaluation  Patient identified by MRN, date of birth, ID band Patient awake    Reviewed: Allergy & Precautions, NPO status , Patient's Chart, lab work & pertinent test results  History of Anesthesia Complications (+) PONV  Airway Mallampati: I  TM Distance: >3 FB Neck ROM: Full    Dental  (+) Dental Advisory Given, Teeth Intact   Pulmonary former smoker   breath sounds clear to auscultation       Cardiovascular negative cardio ROS  Rhythm:Regular Rate:Normal     Neuro/Psych   Anxiety Depression    negative neurological ROS     GI/Hepatic Neg liver ROS,GERD  Controlled,,  Endo/Other  negative endocrine ROS    Renal/GU negative Renal ROS     Musculoskeletal   Abdominal   Peds  Hematology negative hematology ROS (+)   Anesthesia Other Findings   Reproductive/Obstetrics                             Anesthesia Physical Anesthesia Plan  ASA: 1  Anesthesia Plan: General   Post-op Pain Management: Tylenol PO (pre-op)*   Induction: Intravenous  PONV Risk Score and Plan: 4 or greater and Ondansetron, Dexamethasone, TIVA and Scopolamine patch - Pre-op  Airway Management Planned: LMA  Additional Equipment: None  Intra-op Plan:   Post-operative Plan:   Informed Consent: I have reviewed the patients History and Physical, chart, labs and discussed the procedure including the risks, benefits and alternatives for the proposed anesthesia with the patient or authorized representative who has indicated his/her understanding and acceptance.     Dental advisory given  Plan Discussed with: CRNA and Surgeon  Anesthesia Plan Comments:         Anesthesia Quick Evaluation

## 2023-02-26 ENCOUNTER — Ambulatory Visit (HOSPITAL_BASED_OUTPATIENT_CLINIC_OR_DEPARTMENT_OTHER)
Admission: RE | Admit: 2023-02-26 | Discharge: 2023-02-26 | Disposition: A | Discharge status: Home / Self Care | Payer: BC Managed Care – PPO | Attending: Plastic Surgery | Admitting: Plastic Surgery

## 2023-02-26 ENCOUNTER — Encounter (HOSPITAL_BASED_OUTPATIENT_CLINIC_OR_DEPARTMENT_OTHER): Payer: Self-pay | Admitting: Plastic Surgery

## 2023-02-26 ENCOUNTER — Encounter (HOSPITAL_BASED_OUTPATIENT_CLINIC_OR_DEPARTMENT_OTHER)
Admission: RE | Disposition: A | Discharge status: Home / Self Care | Payer: Self-pay | Source: Home / Self Care | Attending: Plastic Surgery

## 2023-02-26 ENCOUNTER — Other Ambulatory Visit: Payer: Self-pay

## 2023-02-26 ENCOUNTER — Ambulatory Visit (HOSPITAL_BASED_OUTPATIENT_CLINIC_OR_DEPARTMENT_OTHER): Payer: BC Managed Care – PPO | Admitting: Anesthesiology

## 2023-02-26 DIAGNOSIS — F32A Depression, unspecified: Secondary | ICD-10-CM | POA: Insufficient documentation

## 2023-02-26 DIAGNOSIS — Z01818 Encounter for other preprocedural examination: Secondary | ICD-10-CM

## 2023-02-26 DIAGNOSIS — Z87891 Personal history of nicotine dependence: Secondary | ICD-10-CM | POA: Diagnosis not present

## 2023-02-26 DIAGNOSIS — K219 Gastro-esophageal reflux disease without esophagitis: Secondary | ICD-10-CM | POA: Insufficient documentation

## 2023-02-26 DIAGNOSIS — Z9882 Breast implant status: Secondary | ICD-10-CM | POA: Diagnosis not present

## 2023-02-26 DIAGNOSIS — Z45812 Encounter for adjustment or removal of left breast implant: Secondary | ICD-10-CM | POA: Insufficient documentation

## 2023-02-26 DIAGNOSIS — Z45811 Encounter for adjustment or removal of right breast implant: Secondary | ICD-10-CM | POA: Insufficient documentation

## 2023-02-26 DIAGNOSIS — F419 Anxiety disorder, unspecified: Secondary | ICD-10-CM | POA: Insufficient documentation

## 2023-02-26 HISTORY — PX: BREAST IMPLANT REMOVAL: SHX5361

## 2023-02-26 LAB — POCT PREGNANCY, URINE: Preg Test, Ur: NEGATIVE

## 2023-02-26 SURGERY — REMOVAL, IMPLANT, BREAST
Anesthesia: General | Site: Breast | Laterality: Bilateral

## 2023-02-26 MED ORDER — HYDROMORPHONE HCL 1 MG/ML IJ SOLN
INTRAMUSCULAR | Status: AC
  Filled 2023-02-26: qty 0.5

## 2023-02-26 MED ORDER — OXYCODONE HCL 5 MG/5ML PO SOLN: 5.0000 mg | Freq: Once | ORAL | Status: DC | PRN

## 2023-02-26 MED ORDER — DEXAMETHASONE SODIUM PHOSPHATE 10 MG/ML IJ SOLN
INTRAMUSCULAR | Status: AC
  Filled 2023-02-26: qty 1

## 2023-02-26 MED ORDER — LIDOCAINE HCL (CARDIAC) PF 100 MG/5ML IV SOSY
PREFILLED_SYRINGE | INTRAVENOUS | Status: DC | PRN
  Administered 2023-02-26: 60 mg via INTRAVENOUS

## 2023-02-26 MED ORDER — PROPOFOL 10 MG/ML IV BOLUS
INTRAVENOUS | Status: AC
  Filled 2023-02-26: qty 20

## 2023-02-26 MED ORDER — MEPERIDINE HCL 25 MG/ML IJ SOLN: 6.2500 mg | INTRAMUSCULAR | Status: DC | PRN

## 2023-02-26 MED ORDER — MIDAZOLAM HCL 2 MG/2ML IJ SOLN: 0.5000 mg | Freq: Once | INTRAMUSCULAR | Status: DC | PRN

## 2023-02-26 MED ORDER — OXYCODONE HCL 5 MG PO TABS: 5.0000 mg | ORAL_TABLET | Freq: Once | ORAL | Status: DC | PRN

## 2023-02-26 MED ORDER — HYDROMORPHONE HCL 1 MG/ML IJ SOLN
INTRAMUSCULAR | Status: DC | PRN
  Administered 2023-02-26: .5 mg via INTRAVENOUS

## 2023-02-26 MED ORDER — CHLORHEXIDINE GLUCONATE CLOTH 2 % EX PADS: 6.0000 | MEDICATED_PAD | Freq: Once | CUTANEOUS | Status: DC

## 2023-02-26 MED ORDER — ONDANSETRON HCL 4 MG/2ML IJ SOLN
INTRAMUSCULAR | Status: DC | PRN
  Administered 2023-02-26: 4 mg via INTRAVENOUS

## 2023-02-26 MED ORDER — AMISULPRIDE (ANTIEMETIC) 5 MG/2ML IV SOLN: 5.0000 mg | Freq: Once | INTRAVENOUS | Status: DC

## 2023-02-26 MED ORDER — ONDANSETRON HCL 4 MG/2ML IJ SOLN
INTRAMUSCULAR | Status: AC
  Filled 2023-02-26: qty 2

## 2023-02-26 MED ORDER — AMISULPRIDE (ANTIEMETIC) 5 MG/2ML IV SOLN
5.0000 mg | Freq: Once | INTRAVENOUS | Status: AC
  Administered 2023-02-26: 5 mg via INTRAVENOUS

## 2023-02-26 MED ORDER — PROMETHAZINE HCL 25 MG/ML IJ SOLN
6.2500 mg | INTRAMUSCULAR | Status: DC | PRN
  Administered 2023-02-26: 6.25 mg via INTRAVENOUS

## 2023-02-26 MED ORDER — FENTANYL CITRATE (PF) 100 MCG/2ML IJ SOLN
INTRAMUSCULAR | Status: AC
  Filled 2023-02-26: qty 2

## 2023-02-26 MED ORDER — BUPIVACAINE-EPINEPHRINE 0.25% -1:200000 IJ SOLN
INTRAMUSCULAR | Status: DC | PRN
  Administered 2023-02-26: 30 mL

## 2023-02-26 MED ORDER — VANCOMYCIN HCL IN DEXTROSE 1-5 GM/200ML-% IV SOLN
INTRAVENOUS | Status: AC
  Filled 2023-02-26: qty 200

## 2023-02-26 MED ORDER — FENTANYL CITRATE (PF) 100 MCG/2ML IJ SOLN
INTRAMUSCULAR | Status: DC | PRN
  Administered 2023-02-26: 25 ug via INTRAVENOUS
  Administered 2023-02-26: 100 ug via INTRAVENOUS
  Administered 2023-02-26: 50 ug via INTRAVENOUS
  Administered 2023-02-26: 25 ug via INTRAVENOUS

## 2023-02-26 MED ORDER — AMISULPRIDE (ANTIEMETIC) 5 MG/2ML IV SOLN
INTRAVENOUS | Status: AC
  Filled 2023-02-26: qty 2

## 2023-02-26 MED ORDER — PROPOFOL 10 MG/ML IV BOLUS
INTRAVENOUS | Status: DC | PRN
Start: 2023-02-26 — End: 2023-02-26
  Administered 2023-02-26: 150 mg via INTRAVENOUS

## 2023-02-26 MED ORDER — DEXAMETHASONE SODIUM PHOSPHATE 10 MG/ML IJ SOLN
INTRAMUSCULAR | Status: DC | PRN
  Administered 2023-02-26: 5 mg via INTRAVENOUS

## 2023-02-26 MED ORDER — SCOPOLAMINE 1 MG/3DAYS TD PT72
MEDICATED_PATCH | TRANSDERMAL | Status: AC
  Filled 2023-02-26: qty 1

## 2023-02-26 MED ORDER — ACETAMINOPHEN 500 MG PO TABS
ORAL_TABLET | ORAL | Status: AC
  Filled 2023-02-26: qty 2

## 2023-02-26 MED ORDER — MIDAZOLAM HCL 2 MG/2ML IJ SOLN
INTRAMUSCULAR | Status: DC | PRN
  Administered 2023-02-26: 2 mg via INTRAVENOUS

## 2023-02-26 MED ORDER — ACETAMINOPHEN 500 MG PO TABS
1000.0000 mg | ORAL_TABLET | Freq: Once | ORAL | Status: AC
  Administered 2023-02-26: 1000 mg via ORAL

## 2023-02-26 MED ORDER — VANCOMYCIN HCL IN DEXTROSE 1-5 GM/200ML-% IV SOLN
1000.0000 mg | INTRAVENOUS | Status: AC
  Administered 2023-02-26: 1000 mg via INTRAVENOUS

## 2023-02-26 MED ORDER — KETOROLAC TROMETHAMINE 30 MG/ML IJ SOLN
INTRAMUSCULAR | Status: DC | PRN
  Administered 2023-02-26: 30 mg via INTRAVENOUS

## 2023-02-26 MED ORDER — PROMETHAZINE HCL 25 MG/ML IJ SOLN
INTRAMUSCULAR | Status: AC
  Filled 2023-02-26: qty 1

## 2023-02-26 MED ORDER — MIDAZOLAM HCL 2 MG/2ML IJ SOLN
INTRAMUSCULAR | Status: AC
  Filled 2023-02-26: qty 2

## 2023-02-26 MED ORDER — LIDOCAINE 2% (20 MG/ML) 5 ML SYRINGE
INTRAMUSCULAR | Status: AC
  Filled 2023-02-26: qty 5

## 2023-02-26 MED ORDER — LACTATED RINGERS IV SOLN: INTRAVENOUS | Status: DC

## 2023-02-26 MED ORDER — LACTATED RINGERS IV SOLN: INTRAVENOUS | Status: DC | PRN

## 2023-02-26 MED ORDER — SCOPOLAMINE 1 MG/3DAYS TD PT72
1.0000 | MEDICATED_PATCH | TRANSDERMAL | Status: DC
  Administered 2023-02-26: 1.5 mg via TRANSDERMAL

## 2023-02-26 MED ORDER — BUPIVACAINE-EPINEPHRINE (PF) 0.25% -1:200000 IJ SOLN
INTRAMUSCULAR | Status: AC
  Filled 2023-02-26: qty 30

## 2023-02-26 MED ORDER — HYDROMORPHONE HCL 1 MG/ML IJ SOLN
0.2500 mg | INTRAMUSCULAR | Status: DC | PRN
  Administered 2023-02-26 (×2): 0.5 mg via INTRAVENOUS

## 2023-02-26 SURGICAL SUPPLY — 69 items
ADH SKN CLS APL DERMABOND .7 (GAUZE/BANDAGES/DRESSINGS) ×2
BAG DECANTER FOR FLEXI CONT (MISCELLANEOUS) ×2 IMPLANT
BINDER BREAST LRG (GAUZE/BANDAGES/DRESSINGS) IMPLANT
BINDER BREAST MEDIUM (GAUZE/BANDAGES/DRESSINGS) IMPLANT
BINDER BREAST XLRG (GAUZE/BANDAGES/DRESSINGS) IMPLANT
BINDER BREAST XXLRG (GAUZE/BANDAGES/DRESSINGS) IMPLANT
BIOPATCH RED 1 DISK 7.0 (GAUZE/BANDAGES/DRESSINGS) IMPLANT
BLADE HEX COATED 2.75 (ELECTRODE) ×2 IMPLANT
BLADE SURG 15 STRL LF DISP TIS (BLADE) ×2 IMPLANT
BLADE SURG 15 STRL SS (BLADE) ×1
BNDG GAUZE DERMACEA FLUFF 4 (GAUZE/BANDAGES/DRESSINGS) ×4 IMPLANT
BNDG GZE DERMACEA 4 6PLY (GAUZE/BANDAGES/DRESSINGS) ×2
CANISTER SUCT 1200ML W/VALVE (MISCELLANEOUS) ×2 IMPLANT
COVER BACK TABLE 60X90IN (DRAPES) IMPLANT
COVER MAYO STAND STRL (DRAPES) ×2 IMPLANT
DERMABOND ADVANCED .7 DNX12 (GAUZE/BANDAGES/DRESSINGS) IMPLANT
DRAIN CHANNEL 19F RND (DRAIN) IMPLANT
ELECT BLADE 4.0 EZ CLEAN MEGAD (MISCELLANEOUS) ×1
ELECT BLADE 6.5 EXT (BLADE) IMPLANT
ELECT REM PT RETURN 9FT ADLT (ELECTROSURGICAL) ×1
ELECTRODE BLDE 4.0 EZ CLN MEGD (MISCELLANEOUS) IMPLANT
ELECTRODE REM PT RTRN 9FT ADLT (ELECTROSURGICAL) ×2 IMPLANT
EVACUATOR SILICONE 100CC (DRAIN) IMPLANT
GAUZE PAD ABD 8X10 STRL (GAUZE/BANDAGES/DRESSINGS) ×2 IMPLANT
GLOVE BIO SURGEON STRL SZ 6.5 (GLOVE) IMPLANT
GLOVE BIO SURGEON STRL SZ8 (GLOVE) ×4 IMPLANT
GLOVE BIOGEL M STRL SZ7.5 (GLOVE) IMPLANT
GLOVE BIOGEL PI IND STRL 7.0 (GLOVE) ×2 IMPLANT
GLOVE BIOGEL PI IND STRL 8 (GLOVE) ×2 IMPLANT
GOWN STRL REUS W/ TWL LRG LVL3 (GOWN DISPOSABLE) ×2 IMPLANT
GOWN STRL REUS W/ TWL XL LVL3 (GOWN DISPOSABLE) ×2 IMPLANT
GOWN STRL REUS W/TWL LRG LVL3 (GOWN DISPOSABLE) ×1
GOWN STRL REUS W/TWL XL LVL3 (GOWN DISPOSABLE) ×3
HIBICLENS CHG 4% 4OZ BTL (MISCELLANEOUS) ×2 IMPLANT
IV NS 1000ML (IV SOLUTION)
IV NS 1000ML BAXH (IV SOLUTION) IMPLANT
IV NS 500ML (IV SOLUTION)
IV NS 500ML BAXH (IV SOLUTION) IMPLANT
KIT FILL ASEPTIC TRANSFER (MISCELLANEOUS) IMPLANT
NDL HYPO 25X1 1.5 SAFETY (NEEDLE) IMPLANT
NDL SAFETY ECLIP 18X1.5 (MISCELLANEOUS) IMPLANT
NEEDLE HYPO 25X1 1.5 SAFETY (NEEDLE) ×1 IMPLANT
NS IRRIG 1000ML POUR BTL (IV SOLUTION) IMPLANT
PACK BASIN DAY SURGERY FS (CUSTOM PROCEDURE TRAY) ×2 IMPLANT
PACK UNIVERSAL I (CUSTOM PROCEDURE TRAY) ×2 IMPLANT
PENCIL SMOKE EVACUATOR (MISCELLANEOUS) ×2 IMPLANT
PIN SAFETY STERILE (MISCELLANEOUS) IMPLANT
SLEEVE SCD COMPRESS KNEE MED (STOCKING) ×2 IMPLANT
SPIKE FLUID TRANSFER (MISCELLANEOUS) IMPLANT
SPONGE T-LAP 18X18 ~~LOC~~+RFID (SPONGE) ×4 IMPLANT
STRIP SUTURE WOUND CLOSURE 1/2 (MISCELLANEOUS) IMPLANT
SUT MNCRL AB 4-0 PS2 18 (SUTURE) ×6 IMPLANT
SUT MON AB 3-0 SH 27 (SUTURE) ×3
SUT MON AB 3-0 SH27 (SUTURE) ×6 IMPLANT
SUT MON AB 5-0 PS2 18 (SUTURE) IMPLANT
SUT PDS AB 2-0 CT2 27 (SUTURE) IMPLANT
SUT PROLENE 3 0 PS 2 (SUTURE) IMPLANT
SUT SILK 3 0 PS 1 (SUTURE) IMPLANT
SUT VIC AB 3-0 SH 27 (SUTURE)
SUT VIC AB 3-0 SH 27X BRD (SUTURE) IMPLANT
SUT VIC AB 4-0 PS2 18 (SUTURE) IMPLANT
SYR 50ML LL SCALE MARK (SYRINGE) IMPLANT
SYR BULB IRRIG 60ML STRL (SYRINGE) ×2 IMPLANT
SYR CONTROL 10ML LL (SYRINGE) IMPLANT
TOWEL GREEN STERILE FF (TOWEL DISPOSABLE) ×4 IMPLANT
TRAY DSU PREP LF (CUSTOM PROCEDURE TRAY) IMPLANT
TUBE CONNECTING 20X1/4 (TUBING) ×2 IMPLANT
UNDERPAD 30X36 HEAVY ABSORB (UNDERPADS AND DIAPERS) ×4 IMPLANT
YANKAUER SUCT BULB TIP NO VENT (SUCTIONS) ×2 IMPLANT

## 2023-02-26 NOTE — Anesthesia Postprocedure Evaluation (Signed)
Anesthesia Post Note  Patient: Lynn Nelson  Procedure(s) Performed: bilateral breast implant removal with capsulectomies (Bilateral: Breast)     Patient location during evaluation: PACU Anesthesia Type: General Level of consciousness: awake and alert, patient cooperative and oriented Pain management: pain level controlled Vital Signs Assessment: post-procedure vital signs reviewed and stable Respiratory status: spontaneous breathing, nonlabored ventilation and respiratory function stable Cardiovascular status: blood pressure returned to baseline and stable Postop Assessment: no apparent nausea or vomiting, able to ambulate and adequate PO intake Anesthetic complications: no   No notable events documented.  Last Vitals:  Vitals:   02/26/23 1000 02/26/23 1015  BP: 111/74 100/61  Pulse: (!) 58 (!) 57  Resp: 16 14  Temp:    SpO2: 100% 100%    Last Pain:  Vitals:   02/26/23 1015  TempSrc:   PainSc: 4                  Keenon Leitzel,E. Artem Bunte

## 2023-02-26 NOTE — Op Note (Signed)
DATE OF OPERATION: 02/26/2023  LOCATION: [facility] operating Room  PREOPERATIVE DIAGNOSIS: Breast implants, desire to have the implants removed  POSTOPERATIVE DIAGNOSIS: Same  PROCEDURE: Removal bilateral breast implants with capsulectomy  SURGEON: Loren Racer, MD  ASSISTANT: Matt Scheeler  EBL: 25 cc  CONDITION: Stable  COMPLICATIONS: None  INDICATION: The patient, Lynn Nelson, is a 47 y.o. female born on 10-Oct-1975, is here for treatment upper back and neck pain which the patient feels is due to the large size of her implants.  She is requesting removal of her bilateral implants.   PROCEDURE DETAILS:  The patient was seen prior to surgery and marked.   IV antibiotics were given. The patient was taken to the operating room and given a general anesthetic. A standard time out was performed and all information was confirmed by those in the room. SCDs were placed.   The chest was prepped and draped in usual sterile manner the right breast was addressed first the previous scar was incised and the length of the incision increased to allow for removal of the implants and capsulectomy.  The electrocautery was used to dissected down to the capsule and the anterior and posterior portions of the capsule were dissected with the implant intact once I could no longer dissected around the implant the implant was removed.  The implant was a saline implant 375 cc.  It was intact.  The capsule was then dissected away from the surrounding tissues.  A portion of the capsule approximately 10 x 5 cm which was densely adherent to the anterior chest wall was left intact due to safety.  This capsule was then fulgurated with the electrocautery.  The surgical site was irrigated with warm normal saline hemostasis was achieved with the electrocautery and the surgical site packed with saline dampened laparotomy tapes.  Attention was turned to the left breast where similar procedure was performed.  The previous  scar was incised the capsule identified using electrocautery to dissected down to the implant the capsule was dissected with the implant intact for the first half of the capsulectomy.  The implant was then removed.  The implant was a 375 cc saline implant which was intact.  The remainder of the capsule was removed with the exception of a 10 x 5 cm portion which was densely adherent to the anterior chest wall.  This portion of the capsule was fulgurated.  Both capsules were sent as right and left breast capsules to pathology for routine examination.  The left breast was irrigated and hemostasis achieved.  The muscle and subcutaneous tissues on both breast were infiltrated with quarter percent Marcaine with epinephrine.  The incisions were also infiltrated with quarter percent Marcaine with epinephrine.  The the wounds were inspected again for bleeding no bleeding was noted.  [address] drains were placed 1 in each breast and brought out through separate stab incisions.  The wounds were then closed with interrupted and running 3-0 Monocryl sutures in the dermis and a running 4-0 Monocryl subcuticular stitch.  The incisions were sealed with Dermabond.  The patient was placed in a supportive garment after dressings were applied and she was awakened from anesthesia without incident.  She was transferred to the recovery room in good condition.  All instrument needle and sponge counts were reported as correct. The patient was allowed to wake up and taken to recovery room in stable condition at the end of the case. The family was notified at the end of the  case.   The advanced practice practitioner (APP) assisted throughout the case.  The APP was essential in retraction and counter traction when needed to make the case progress smoothly.  This retraction and assistance made it possible to see the tissue plans for the procedure.  The assistance was needed for blood control, tissue re-approximation and assisted with  closure of the incision site.

## 2023-02-26 NOTE — Transfer of Care (Signed)
Immediate Anesthesia Transfer of Care Note  Patient: Lynn Nelson  Procedure(s) Performed: bilateral breast implant removal with capsulectomies (Bilateral: Breast)  Patient Location: PACU  Anesthesia Type:General  Level of Consciousness: awake, alert , and oriented  Airway & Oxygen Therapy: Patient Spontanous Breathing and Patient connected to face mask oxygen  Post-op Assessment: Report given to RN and Post -op Vital signs reviewed and stable  Post vital signs: Reviewed and stable  Last Vitals:  Vitals Value Taken Time  BP 117/67 02/26/23 0925  Temp    Pulse 71 02/26/23 0926  Resp 11 02/26/23 0926  SpO2 100 % 02/26/23 0926  Vitals shown include unfiled device data.  Last Pain:  Vitals:   02/26/23 0646  TempSrc: Oral  PainSc: 0-No pain      Patients Stated Pain Goal: 4 (02/26/23 1610)  Complications: No notable events documented.

## 2023-02-26 NOTE — Anesthesia Procedure Notes (Signed)
Procedure Name: LMA Insertion Date/Time: 02/26/2023 7:47 AM  Performed by: Karen Kitchens, CRNAPre-anesthesia Checklist: Patient identified, Emergency Drugs available, Suction available and Patient being monitored Patient Re-evaluated:Patient Re-evaluated prior to induction Oxygen Delivery Method: Circle system utilized Preoxygenation: Pre-oxygenation with 100% oxygen Induction Type: IV induction Ventilation: Mask ventilation without difficulty LMA: LMA inserted LMA Size: 4.0 Number of attempts: 1 Airway Equipment and Method: Bite block Placement Confirmation: positive ETCO2, CO2 detector and breath sounds checked- equal and bilateral Tube secured with: Tape Dental Injury: Teeth and Oropharynx as per pre-operative assessment

## 2023-02-26 NOTE — Interval H&P Note (Signed)
History and Physical Interval Note: No change in exam or indication for surgery. Surgical site marked with her concurrence. Will proceed with removal of bilateral breast implants at her request.  02/26/2023 6:58 AM  Forde Radon  has presented today for surgery, with the diagnosis of s/p breast augmentation.  The various methods of treatment have been discussed with the patient and family. After consideration of risks, benefits and other options for treatment, the patient has consented to  Procedure(s): bilateral breast implant removal (Bilateral) as a surgical intervention.  The patient's history has been reviewed, patient examined, no change in status, stable for surgery.  I have reviewed the patient's chart and labs.  Questions were answered to the patient's satisfaction.     [name]

## 2023-02-26 NOTE — Discharge Instructions (Addendum)
Activity As tolerated. NO showers for 24 hours Keep breast wrap on breasts until then. After showering, put wrap back on, this is important for compression. NO driving while in significant pain, taking pain medication or if you are unable to safely react to traffic. No heavy activities Take narcotic pain medication as needed for severe pain. Otherwise, you can use ibuprofen or tylenol as needed. Avoid more than 3,000 mg of tylenol in 24 hours.   You do not need an antibiotic post-operatively unless this was discussed with the provider at your pre-op appointment.   Diet: Regular. Drink plenty of fluids and eat healthy (high protein, low carbs), Try to optimize your nutrition with plenty of fruits and vegetables to improve healing. Protein shakes are a good option.  Wound Care: Keep dressing clean & dry. You may change bandages after showering if you continue to notice some drainage.   Special Instructions: Call Doctor if any unusual problems occur such as pain, excessive Bleeding, unrelieved Nausea/vomiting, Fever &/or chills  Follow-up appointment: Previously scheduled.  No Tylenol before 3:50pm today.  No Ibuprofen/NSAIDS before 5:15pm today   Post Anesthesia Home Care Instructions  Activity: Get plenty of rest for the remainder of the day. A responsible individual must stay with you for 24 hours following the procedure.  For the next 24 hours, DO NOT: -Drive a car -Advertising copywriter -Drink alcoholic beverages -Take any medication unless instructed by your physician -Make any legal decisions or sign important papers.  Meals: Start with liquid foods such as gelatin or soup. Progress to regular foods as tolerated. Avoid greasy, spicy, heavy foods. If nausea and/or vomiting occur, drink only clear liquids until the nausea and/or vomiting subsides. Call your physician if vomiting continues.  Special Instructions/Symptoms: Your throat may feel dry or sore from the anesthesia or the  breathing tube placed in your throat during surgery. If this causes discomfort, gargle with warm salt water. The discomfort should disappear within 24 hours.  If you had a scopolamine patch placed behind your ear for the management of post- operative nausea and/or vomiting:  1. The medication in the patch is effective for 72 hours, after which it should be removed.  Wrap patch in a tissue and discard in the trash. Wash hands thoroughly with soap and water. 2. You may remove the patch earlier than 72 hours if you experience unpleasant side effects which may include dry mouth, dizziness or visual disturbances. 3. Avoid touching the patch. Wash your hands with soap and water after contact with the patch.          JP Drain [employer] this sheet to all of your post-operative appointments while you have your drains. Please measure your drains by CC's or ML's. Make sure you drain and measure your JP Drains 3 times per day. At the end of each day, add up totals for the left side and add up totals for the right side.    ( 9 am )     ( 3 pm )        ( 9 pm )                Date L  R  L  R  L  R  Total L/R

## 2023-03-01 ENCOUNTER — Encounter (HOSPITAL_BASED_OUTPATIENT_CLINIC_OR_DEPARTMENT_OTHER): Payer: Self-pay | Admitting: Plastic Surgery

## 2023-03-01 LAB — SURGICAL PATHOLOGY

## 2023-03-03 ENCOUNTER — Encounter: Payer: Self-pay | Admitting: Plastic Surgery

## 2023-03-03 ENCOUNTER — Ambulatory Visit (INDEPENDENT_AMBULATORY_CARE_PROVIDER_SITE_OTHER): Payer: BC Managed Care – PPO | Admitting: Plastic Surgery

## 2023-03-03 VITALS — BP 123/73 | HR 80 | Ht 64.0 in | Wt 148.4 lb

## 2023-03-03 DIAGNOSIS — Z9886 Personal history of breast implant removal: Secondary | ICD-10-CM

## 2023-03-03 NOTE — Progress Notes (Signed)
Ms. Lynn Nelson returns today approximately 5 days postop from removal of her bilateral implants.  She is overall doing well though she is still somewhat sore from the removal.  She is happy with the appearance of the breast without the implants.  She denies any fevers or chills.  Drain outputs are still 40 and 38 cc.  Incisions are clean dry and intact.  Patient is encouraged to slowly increase her activity.  She will probably need 1 more week out of work.  May return prior to her next scheduled appointment if her drain output falls to 30 mL over 24 hours to have her drains taken out

## 2023-03-04 ENCOUNTER — Telehealth: Payer: Self-pay | Admitting: Plastic Surgery

## 2023-03-04 NOTE — Telephone Encounter (Signed)
Pt called to report that drain totals for yesterday were 31cc on L side and 32cc on R side. When would you like to see her again?  Please call her at [phone]

## 2023-03-04 NOTE — Telephone Encounter (Signed)
French Ana,  Please tell her if they are still that low today she can come in and have the drains removed tomorrow.  Thanks, Ree Kida

## 2023-03-04 NOTE — Telephone Encounter (Signed)
Ms. Giacomo is schedule for tomorrow, 03/05/23, at 3pm with Gerre Pebbles. Thank you.

## 2023-03-05 ENCOUNTER — Ambulatory Visit (INDEPENDENT_AMBULATORY_CARE_PROVIDER_SITE_OTHER): Payer: BC Managed Care – PPO | Admitting: Student

## 2023-03-05 VITALS — BP 114/80 | HR 76

## 2023-03-05 DIAGNOSIS — Z719 Counseling, unspecified: Secondary | ICD-10-CM

## 2023-03-05 DIAGNOSIS — Z9886 Personal history of breast implant removal: Secondary | ICD-10-CM

## 2023-03-05 NOTE — Progress Notes (Signed)
Patient is a pleasant 47 year old female s/p removal of bilateral breast implants performed 02/26/2023 by Dr. Ladona Ridgel who presents to clinic for post operative follow-up.  She was last seen here in clinic two days ago. At that time, drain output was mildly elevated at 38 cc and 40 cc daily from each drain.  She was told that drains can be removed if they were to fall to 30 cc output.  Her exam was otherwise benign and she was pleased.   Today, patient reports she is doing well. She states she is having some discomfort at the drain sites. She states the discomfort is causing her to lose sleep. She reports her drain output has been approximately 33 cc on the left and 34 cc on the right. She also reports she has a blister to her R breast which has opened up. She denies any issues or concerns at this time.   Chaperone present on exam. On exam, patient is sitting upright in no acute distress. Breasts are symmetric and have good shape bilaterally. There is no overlying erythema. NACs appear viable bilaterally. Incisions are clean, dry and intact bilaterally. There appears to be a small superficial wound to the skin of the lateral right breast. There is no drainage. There are no signs of infection on exam. Drains are in place bilaterally and are functioning with serous fluid in the bulbs.   JP drains were removed without difficulty. Patient tolerated well.   Discussed with patient that she may change dressing over the drain site in 24 hours if still draining. Discussed she may apply bandaid over the drain site. Also recommended patient apply vaseline to superficial wound to right breast and cover the area. Patient expressed understanding.   Discussed with the patient she may begin scar massage 2 weeks postop. Discussed with her she should continue to wear compression at all times. Patient expressed understanding.   Encouraged patient to wean off of narcotic pain medication and transition to tylenol and  ibuprofen use for pain. Patient expressed understanding.   Patient to follow up in about 10 days for her next scheduled appointment.   Instructed the patient to call if she has any questions or concerns in the meantime.   Dr. Ladona Ridgel was present during the entirety of today's visit and is in agreement with the plan.

## 2023-03-15 ENCOUNTER — Ambulatory Visit: Payer: BC Managed Care – PPO | Admitting: Student

## 2023-03-15 ENCOUNTER — Encounter: Payer: Self-pay | Admitting: Student

## 2023-03-15 VITALS — BP 124/80 | HR 72 | Ht 64.0 in | Wt 145.0 lb

## 2023-03-15 DIAGNOSIS — Z9886 Personal history of breast implant removal: Secondary | ICD-10-CM

## 2023-03-15 NOTE — Progress Notes (Signed)
Patient is a 47 year old female who underwent removal of bilateral breast implants with Dr. Ladona Ridgel on 02/26/2023.  She presents to the clinic today for postoperative follow-up.  Patient was last seen in the clinic on 03/05/2023.  At this visit, patient reported she was doing well.  She reported she was having some discomfort at the drain sites.  On exam, breasts were symmetric and had good shape bilaterally.  There is no overlying erythema.  NAC's were viable bilaterally.  Incisions were clean dry and intact.  There is a superficial wound to the skin of the lateral right breast.  No signs of infection.  JP drains were removed.  Plan is for patient to follow-up in 10 days for her scheduled appointment.  Today, patient reports she is doing well.  She states that her pain is improving since her drain removal.  She reports the drain sites are still slightly tender.  She denies any fevers or chills.  She denies any drainage from her incisions.  She reports she has been massaging her incisions.  Chaperone present on exam.  On exam, patient is sitting upright in no acute distress.  Breasts are soft and symmetric.  They have good shape.  There is no overlying erythema.  There are no obvious fluid collections palpated on exam.  Incisions are intact and healing well bilaterally.  Scars are slightly raised.  Drain sites are healing well.  There is some superficial scabbing.  No signs of infection on exam.  Discussed with the patient that I would like her to continue compression at all times and avoid strenuous activities.  Also recommended to the patient that she apply silicone based products to her scars and to make sure she is massaging at least twice a day.  Recommended she apply Vaseline to her drain sites.  Patient expressed understanding.  Patient to follow back up in a few weeks at her neck scheduled appointment.  I instructed her to call in the meantime she has any questions or concerns.

## 2023-03-17 ENCOUNTER — Telehealth: Payer: Self-pay

## 2023-03-17 NOTE — Telephone Encounter (Signed)
Boneta Lucks from pt's disability provider called to verify if recent surgery (implant removal) was covered by medical insurance.   Call back # [phone], ext [phone]  Please advise.   Thanks!

## 2023-03-18 NOTE — Telephone Encounter (Signed)
Hi [place],  Not sure if this was just sent to me or I was cc'd. I can't answer that question, it needs top go to [place] or [place].   Thanks, Ree Kida

## 2023-03-29 DIAGNOSIS — N951 Menopausal and female climacteric states: Secondary | ICD-10-CM | POA: Diagnosis not present

## 2023-03-31 ENCOUNTER — Ambulatory Visit: Payer: BC Managed Care – PPO | Admitting: Student

## 2023-03-31 ENCOUNTER — Encounter: Payer: Self-pay | Admitting: Student

## 2023-03-31 VITALS — BP 132/76 | HR 105 | Ht 64.0 in | Wt 145.0 lb

## 2023-03-31 DIAGNOSIS — Z9886 Personal history of breast implant removal: Secondary | ICD-10-CM

## 2023-03-31 NOTE — Progress Notes (Signed)
Patient is a 47 year old female who underwent removal of bilateral breast implants with Dr. Ladona Ridgel on 02/26/2023.  She presents to the clinic today for postoperative follow-up.     She was last seen in the clinic on 03/15/2023.  At this visit, patient was doing well.  On exam, her breasts are soft and symmetric.  They have good shape.  There is no overlying erythema.  No obvious fluid collections.  Plan is for patient to continue compression and avoid strenuous activities.  Today, patient reports she is doing okay.  She states that a few days ago, she noticed some yellowish drainage from her right breast incision.  She states that the drainage had stopped, but she felt her breast on the right was swelling and has stayed swollen for the past few days.  She denies any overlying redness, fevers or chills.  She denies any other issues or concerns at this time.  Chaperone present on exam.  On exam, patient is sitting upright in no acute distress.  Left breast is soft.  There is no overlying erythema.  No fluid collection on exam.  NAC is healthy.  Right breast is soft, slightly bigger than the left breast.  There is no overlying erythema.  There is no significant tenderness to palpation.  There is some possible fluid palpated on exam.  Incision to the left breast has healed nicely.  There is a small pinpoint wound noted to the lateral aspect of the right breast incision.  There is no surrounding erythema or drainage.  No signs of infection on exam.  Discussed with patient that she may have fluid in her right breast.  Did discuss with her the benefits and risks of aspiration in the clinic here today including fluid removal but also including risk of infection.  Patient opted to move forward with aspiration.  Area was cleaned with alcohol.  Butterfly needle was used to attempt aspiration, but no fluid was aspirated.  Dr. Ladona Ridgel was also able to evaluate the patient today.  Will have patient get an ultrasound  with possible aspiration to the right breast.  In the meantime, recommended patient continue compression at all times.  Patient expressed understanding.  Patient to follow back up next week.  I discussed with her that if she has any changes such as redness, increased pain, increased swelling, fevers, chills, or has any concerns, to call us.  Patient expressed understanding.  Pictures were obtained of the patient and placed in the chart with the patient's or guardian's permission.

## 2023-03-31 NOTE — Telephone Encounter (Signed)
Left message for Boneta Lucks advising information Herbert Seta gave with cb # if she had any other questions.

## 2023-04-01 ENCOUNTER — Ambulatory Visit
Admission: RE | Admit: 2023-04-01 | Discharge: 2023-04-01 | Disposition: A | Discharge status: Home / Self Care | Payer: BC Managed Care – PPO | Source: Ambulatory Visit | Attending: Student | Admitting: Student

## 2023-04-01 ENCOUNTER — Other Ambulatory Visit: Payer: Self-pay | Admitting: Student

## 2023-04-01 ENCOUNTER — Encounter: Payer: BC Managed Care – PPO | Admitting: Student

## 2023-04-01 DIAGNOSIS — Z9886 Personal history of breast implant removal: Secondary | ICD-10-CM | POA: Insufficient documentation

## 2023-04-01 DIAGNOSIS — N644 Mastodynia: Secondary | ICD-10-CM | POA: Diagnosis not present

## 2023-04-01 DIAGNOSIS — N63 Unspecified lump in unspecified breast: Secondary | ICD-10-CM

## 2023-04-01 DIAGNOSIS — M96843 Postprocedural seroma of a musculoskeletal structure following other procedure: Secondary | ICD-10-CM | POA: Insufficient documentation

## 2023-04-01 DIAGNOSIS — N6310 Unspecified lump in the right breast, unspecified quadrant: Secondary | ICD-10-CM | POA: Diagnosis not present

## 2023-04-01 DIAGNOSIS — T888XXA Other specified complications of surgical and medical care, not elsewhere classified, initial encounter: Secondary | ICD-10-CM

## 2023-04-01 DIAGNOSIS — R92321 Mammographic fibroglandular density, right breast: Secondary | ICD-10-CM | POA: Diagnosis not present

## 2023-04-01 DIAGNOSIS — N611 Abscess of the breast and nipple: Secondary | ICD-10-CM | POA: Diagnosis not present

## 2023-04-01 MED ORDER — LIDOCAINE HCL 1 % IJ SOLN
10.0000 mL | Freq: Once | INTRAMUSCULAR | Status: AC
  Administered 2023-04-01: 10 mL
  Filled 2023-04-01: qty 10

## 2023-04-04 LAB — AEROBIC/ANAEROBIC CULTURE W GRAM STAIN (SURGICAL/DEEP WOUND)

## 2023-04-05 ENCOUNTER — Telehealth: Payer: Self-pay | Admitting: Student

## 2023-04-05 NOTE — Telephone Encounter (Signed)
I called the patient to check in to see how she was doing after her aspiration.  She states that she is doing well, but she is still little bit sore.  She does feel that there is improvement in her right breast after the aspiration.  I did discuss with her that they did culture some of the fluid, but nothing has grown yet.  I encouraged her to continue to wear compression at all times.  I did discuss with her that if the fluid collection does return, she may need to be aspirated again or even go to the operating room to have a drain placed.  Patient expressed understanding.  Patient has appointment later this week.  I discussed with her that she should call in the meantime if she has any questions or concerns about anything.  Patient expressed understanding.

## 2023-04-05 NOTE — Progress Notes (Signed)
Thank you :)

## 2023-04-08 ENCOUNTER — Encounter: Payer: Self-pay | Admitting: Student

## 2023-04-08 ENCOUNTER — Ambulatory Visit (INDEPENDENT_AMBULATORY_CARE_PROVIDER_SITE_OTHER): Payer: BC Managed Care – PPO | Admitting: Student

## 2023-04-08 VITALS — BP 121/77 | HR 82 | Temp 98.6°F | Ht 64.0 in | Wt 147.2 lb

## 2023-04-08 DIAGNOSIS — Z9886 Personal history of breast implant removal: Secondary | ICD-10-CM

## 2023-04-08 MED ORDER — DOXYCYCLINE HYCLATE 100 MG PO TABS: 100.0000 mg | ORAL_TABLET | Freq: Two times a day (BID) | ORAL | 0 refills | Status: AC

## 2023-04-08 NOTE — Progress Notes (Signed)
Patient is a 47 year old female who underwent removal of bilateral breast implants with Dr. Ladona Ridgel on 02/26/2023.  She presents to the clinic today for postoperative follow-up.         Patient was last seen in the clinic on 03/31/2023.  At this visit, patient reported she was doing okay.  She did notice some swelling to her right breast.  On exam, right breast was soft, slightly bigger than the left breast.  Incisions had healed nicely.  There was a small pinpoint wound noted to the lateral aspect of the right breast incision.  Aspiration was attempted to the right breast, but nothing was aspirated.  Ultrasound was ordered for the patient for right breast with possible aspiration.  Patient underwent ultrasound with aspiration on 04/01/2023.  A fluid collection that measured 16.3 x 4.8 x 14.7 cm was noted.  Patient underwent ultrasound-guided aspiration of the right breast where 265 mL of clear straw-colored fluid was aspirated.  Some of the fluid aspirated was sent for culture.  Cultures show rare WBC present, rare Peptostreptococcus Asaccharolytius.  Susceptibility was not available for this organism.  Patient was called on Monday and she was not having any infectious symptoms.  She was instructed to call back if she was experiencing any infectious symptoms.  Today, patient reports she is doing okay.  She states that she feels there is more fluid in her right breast again.  She states that her right breast feels full and it is uncomfortable.  She reports that she is anxious and stressed about how moving forward it is going to affect her working.  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  She otherwise states that she has no new issues or concerns.  Denies any issues on the left side.  She denies any fevers or chills.  Denies any drainage from her incisions.  Chaperone present on exam.  On exam, patient is sitting upright in no acute distress.  Left breast is soft.  No fluid  collections palpated on exam.  No overlying erythema.  NAC is viable incision is healing well.  Right breast is larger than left breast, similar to previous exam.  Suspect deep fluid collection has reaccumulated.  There is no overlying erythema.  No tenderness to palpation.  NAC is healthy.  There is still a pinpoint wound to the right lateral aspect of the incision.  No surrounding erythema.  No drainage.  No active signs of infection on exam.  Dr. Ladona Ridgel also had the opportunity to be present at today's visit and examined the patient.  Discussed with patient that we will continue to monitor her symptoms, but given that she is not having any infectious symptoms we will hold off on starting an antibiotic for now.  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  We will have patient start antibiotics at that time.  Patient expressed understanding.  Discussed with patient to wear compression at all times.  Suggested that she wear extra padding within her compressive sports bra or an Ace wrap to help with adequate compression.  Patient expressed understanding.  I offered to prescribe the patient a muscle relaxer to help with her pain.  She declined at this time.  Recommended she continue to take Tylenol and ibuprofen.  Patient expressed understanding.  Patient to follow back up next week.  I instructed the patient to  call back in the meantime if she notices any changes to her breast, if she experiences any increased pain, redness, drainage, fevers or chills, or has any concerns.  I discussed with patient to have a low threshold to call us back.  Patient expressed understanding.

## 2023-04-09 DIAGNOSIS — L7634 Postprocedural seroma of skin and subcutaneous tissue following other procedure: Secondary | ICD-10-CM | POA: Diagnosis not present

## 2023-04-09 DIAGNOSIS — Y838 Other surgical procedures as the cause of abnormal reaction of the patient, or of later complication, without mention of misadventure at the time of the procedure: Secondary | ICD-10-CM | POA: Diagnosis not present

## 2023-04-09 DIAGNOSIS — N611 Abscess of the breast and nipple: Secondary | ICD-10-CM | POA: Diagnosis not present

## 2023-04-09 DIAGNOSIS — Z880 Allergy status to penicillin: Secondary | ICD-10-CM | POA: Diagnosis not present

## 2023-04-09 DIAGNOSIS — N6310 Unspecified lump in the right breast, unspecified quadrant: Secondary | ICD-10-CM | POA: Diagnosis not present

## 2023-04-09 DIAGNOSIS — Z7982 Long term (current) use of aspirin: Secondary | ICD-10-CM | POA: Diagnosis not present

## 2023-04-09 DIAGNOSIS — T8549XA Other mechanical complication of breast prosthesis and implant, initial encounter: Secondary | ICD-10-CM | POA: Diagnosis not present

## 2023-04-09 DIAGNOSIS — Z9882 Breast implant status: Secondary | ICD-10-CM | POA: Diagnosis not present

## 2023-04-09 DIAGNOSIS — T859XXA Unspecified complication of internal prosthetic device, implant and graft, initial encounter: Secondary | ICD-10-CM | POA: Diagnosis not present

## 2023-04-09 DIAGNOSIS — T8142XA Infection following a procedure, deep incisional surgical site, initial encounter: Secondary | ICD-10-CM | POA: Diagnosis not present

## 2023-04-09 DIAGNOSIS — N6121 Granulomatous mastitis, right breast: Secondary | ICD-10-CM | POA: Diagnosis not present

## 2023-04-12 ENCOUNTER — Ambulatory Visit (INDEPENDENT_AMBULATORY_CARE_PROVIDER_SITE_OTHER): Payer: BC Managed Care – PPO | Admitting: Plastic Surgery

## 2023-04-12 ENCOUNTER — Encounter: Payer: Self-pay | Admitting: Plastic Surgery

## 2023-04-12 VITALS — BP 126/73 | HR 95

## 2023-04-12 DIAGNOSIS — N6489 Other specified disorders of breast: Secondary | ICD-10-CM

## 2023-04-12 DIAGNOSIS — Z9886 Personal history of breast implant removal: Secondary | ICD-10-CM

## 2023-04-12 NOTE — Progress Notes (Signed)
Lynn Nelson underwent removal of her bilateral breast implants February 26, 2023 she initially did well then she developed a seroma in the right breast.  The seroma was drained under ultrasound guidance however she had a recurrence and was seen at another facility over the weekend where she underwent irrigation and drain placement.  She returns today for follow-up.  She is afebrile today and denies fevers or chills.  Her incision is clean dry and intact in the output from her drain is a clear serosanguineous fluid.  There is no erythema on the breast.  Status post seroma drainage and drain placement: Overall she is doing well.  She notes that her upper back and neck pain is significantly improved since the removal of her breast implants.  She is also doing better since she had the seroma cavity irrigated and drain placed. Will continue with the drain for the remainder of the week.  If her drainage remains below 30 mL/day I will have her return on Friday for drain removal.  If not she will return to see me next week at which time we will remove the drain.  She will continue her antibiotics until complete.  Am out of work note was provided today.

## 2023-04-13 ENCOUNTER — Encounter: Payer: BC Managed Care – PPO | Admitting: Plastic Surgery

## 2023-04-16 ENCOUNTER — Ambulatory Visit: Payer: BC Managed Care – PPO | Admitting: Physician Assistant

## 2023-04-16 ENCOUNTER — Encounter: Payer: Self-pay | Admitting: Physician Assistant

## 2023-04-16 VITALS — BP 128/84 | HR 94

## 2023-04-16 DIAGNOSIS — Z9886 Personal history of breast implant removal: Secondary | ICD-10-CM

## 2023-04-16 NOTE — Progress Notes (Signed)
Patient is a pleasant 47 year old female s/p removal of bilateral breast implants performed 02/26/2023 by Dr. Ladona Ridgel who unfortunately developed a right breast seroma requiring ultrasound-guided drain placement 04/09/2023 who returns to clinic for postoperative follow-up.  She was seen by Dr. Ladona Ridgel 04/12/2023 at which time her exam was benign.  Plan was for her to leave the drain in place for remainder of week and removed on Friday if output remained below 30 cc/day.  If not, she can return to clinic the following week for reevaluation.  Today, patient is doing well.  Output has been consistently less than 20 cc for the past 4 days and she is hopeful for drain removal today.  She states the drain tube is causing her some discomfort.  She reports that per conversation with Dr. Ladona Ridgel, it can be removed.  On exam, breasts have good shape and symmetry.  NAC's are healthy and viable.  No obvious seroma or other subcutaneous fluid collections appreciated.  No ballottement.  No overlying skin changes.  No evidence concerning for infection.  Drain tube intact and functional.  Drain removed without complication or difficulty.  Well-tolerated by patient.  Follow-up as scheduled.  She will call clinic should she have questions or concerns in interim.  Picture(s) obtained of the patient and placed in the chart were with the patient's or guardian's permission.

## 2023-04-20 ENCOUNTER — Ambulatory Visit (INDEPENDENT_AMBULATORY_CARE_PROVIDER_SITE_OTHER): Payer: BC Managed Care – PPO | Admitting: Student

## 2023-04-20 ENCOUNTER — Encounter: Payer: Self-pay | Admitting: Student

## 2023-04-20 DIAGNOSIS — Z9886 Personal history of breast implant removal: Secondary | ICD-10-CM

## 2023-04-20 DIAGNOSIS — N6489 Other specified disorders of breast: Secondary | ICD-10-CM

## 2023-04-20 NOTE — Progress Notes (Signed)
Patient is a 47 year old female who underwent bilateral breast implant removal with Dr. Ladona Ridgel on 02/26/2023.  She eventually developed a right breast seroma requiring incision and drainage as well as drain placement on 04/09/2023.  She presents to the clinic today for follow-up.  She was last seen in the clinic on 04/16/2023.  At this visit, patient was doing well.  Her drain had been consistently putting out less than 20 cc for approximately 4 days.  On exam, her breasts had good shape and symmetry.  NAC's were healthy and viable.  No obvious seroma or subcutaneous fluid collections noted.  No overlying skin changes.  Drain was removed without difficulty.  Today, patient reports she is doing well.  She states that she is feeling much better, but is still feeling a little bit tired.  She states that she has 1 to 2 days left of her antibiotics.  She states that she is taking doxycycline and levofloxacin.  She denies any current nausea or vomiting.  She reports that taking the antibiotics with food has been helping.  She denies any fevers or chills.  Denies any new swelling to the right breast.  Reports that pain has been improving.  She states that she has no longer been taking pain medications.  Patient states that she would like to return to work tomorrow.  Chaperone present on exam.  On exam, patient is sitting upright in no acute distress.  Right breast is soft.  No overlying erythema or skin changes.  No fluid collections palpated.  Inframammary incision appears to be healing well.  No active drainage or signs of infection.  Left breast is soft and the incision is well-healed.  NAC's are viable bilaterally.  Recommended that patient continue to monitor her breasts.  Discussed with her that she should complete her course of antibiotics as directed.  Patient expressed understanding.  Discussed with patient to continue to eat regularly, drink plenty of water and to make sure she is getting adequate amount  of sleep.  Discussed with her that she should avoid heavy lifting or vigorous activities when going back to work.  Discussed with her that if she needs to take the rest of the week off for work, I am more than happy to write her out.  Patient expressed understanding.  Patient to follow back up in 2 weeks.  Instructed the patient to call in the meantime she has any questions or concerns about anything.

## 2023-05-01 IMAGING — MG DIGITAL SCREENING BREAST BILAT IMPLANT W/ TOMO W/ CAD
8 of 12 series · 8 of 28 positions shown · non-contrast
Comparison: Previous exam(s).

CLINICAL DATA: Screening.

EXAM:
DIGITAL SCREENING BILATERAL MAMMOGRAM WITH IMPLANTS, CAD AND
TOMOSYNTHESIS
TECHNIQUE: Bilateral screening digital craniocaudal and mediolateral oblique
mammograms were obtained. Bilateral screening digital breast
tomosynthesis was performed. The images were evaluated with
computer-aided detection. Standard and/or implant displaced views
were performed.

[L MLO]
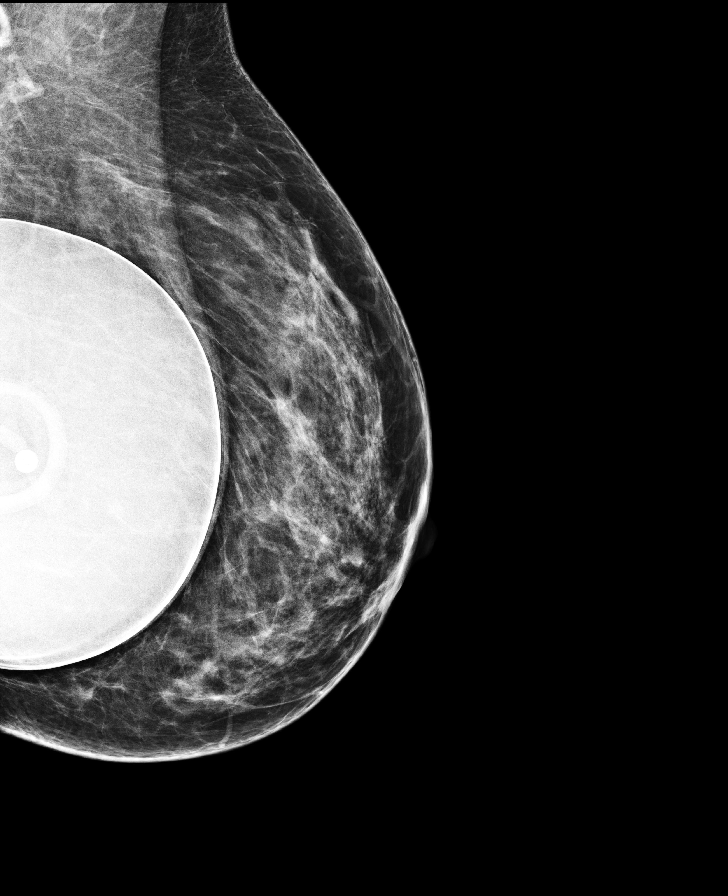

[L CC]
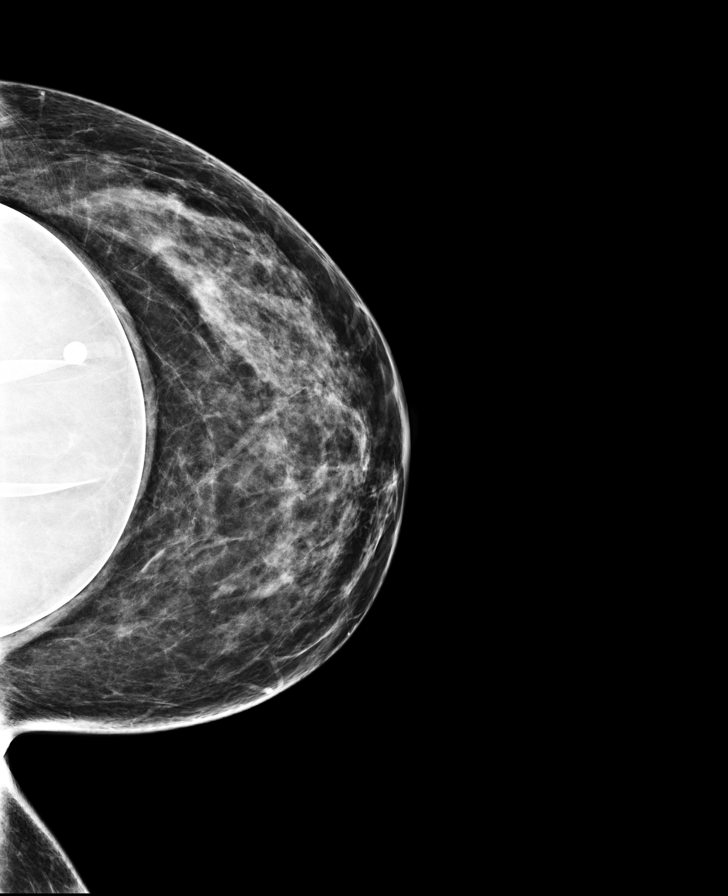

[R MLO]
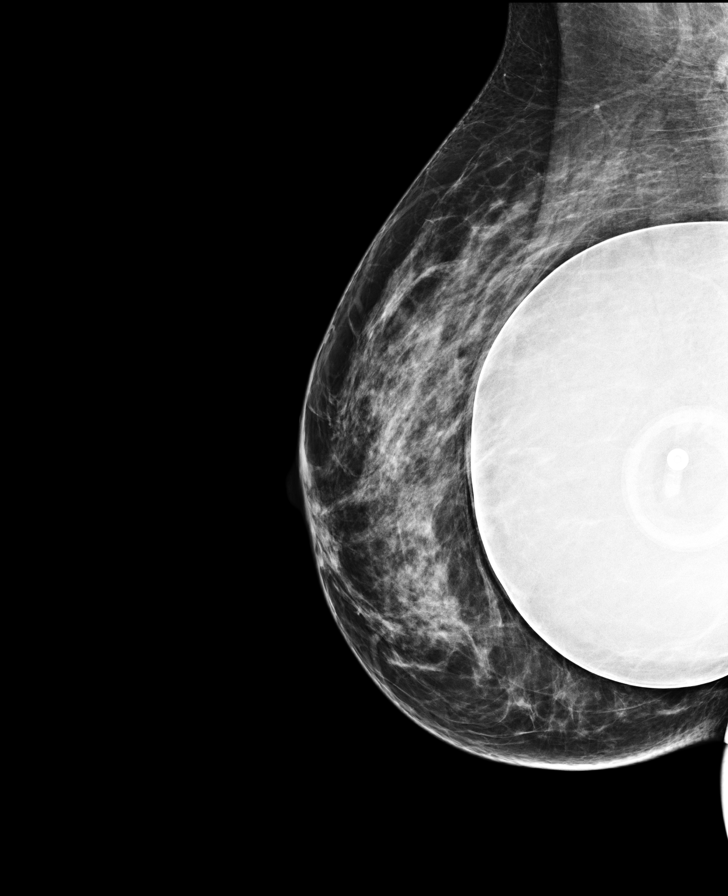

[R CC]
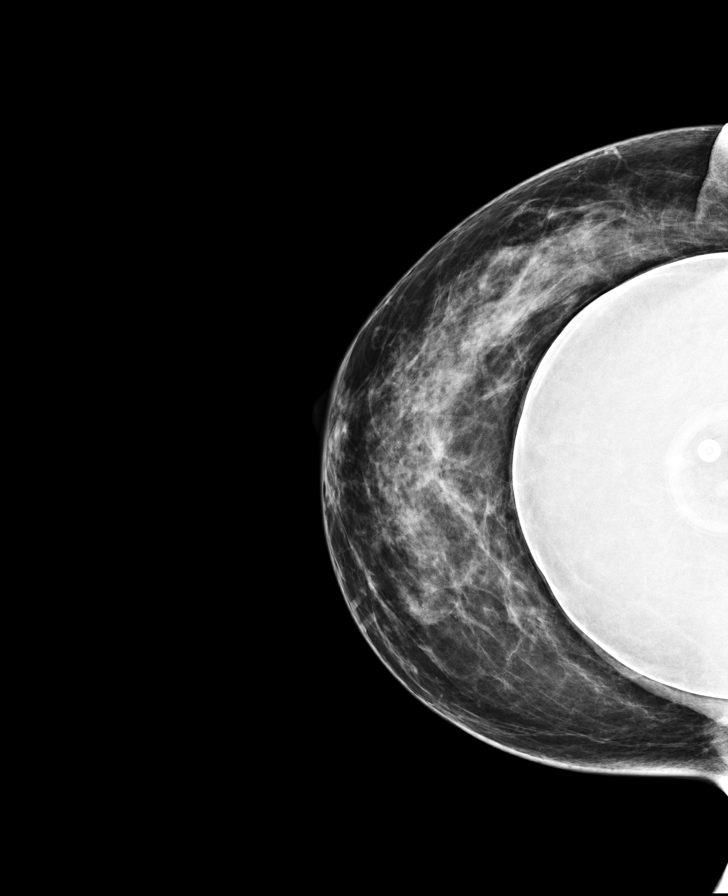

[R MLO synth-2D]
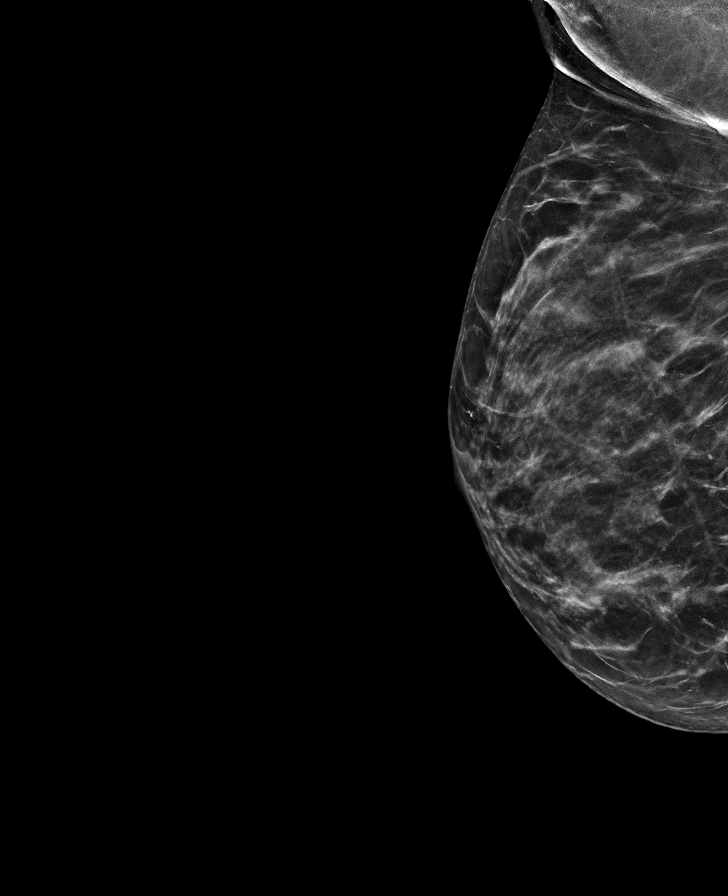

[L MLO synth-2D]
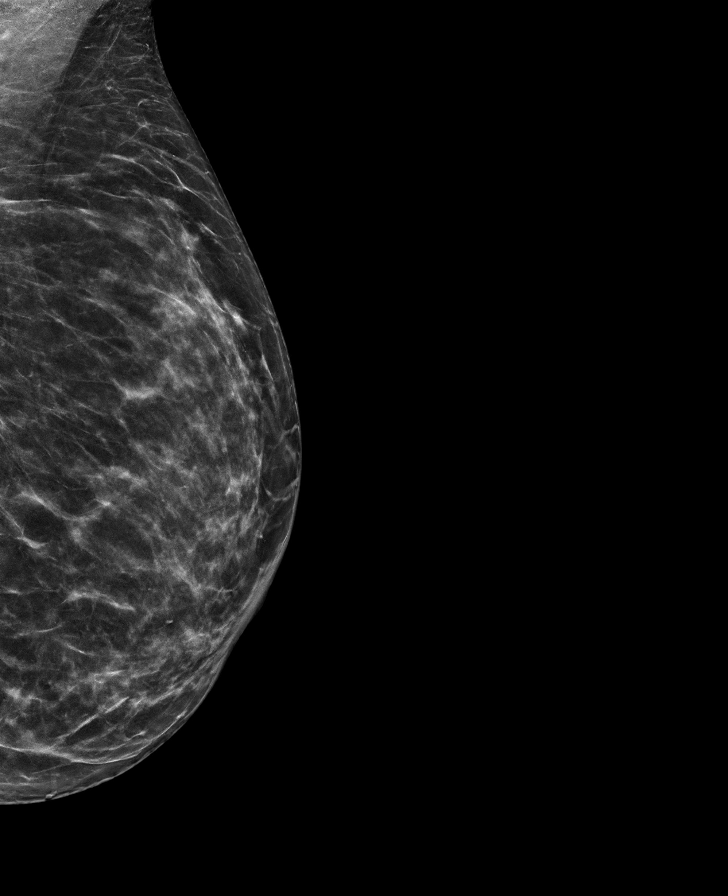

[R CC synth-2D]
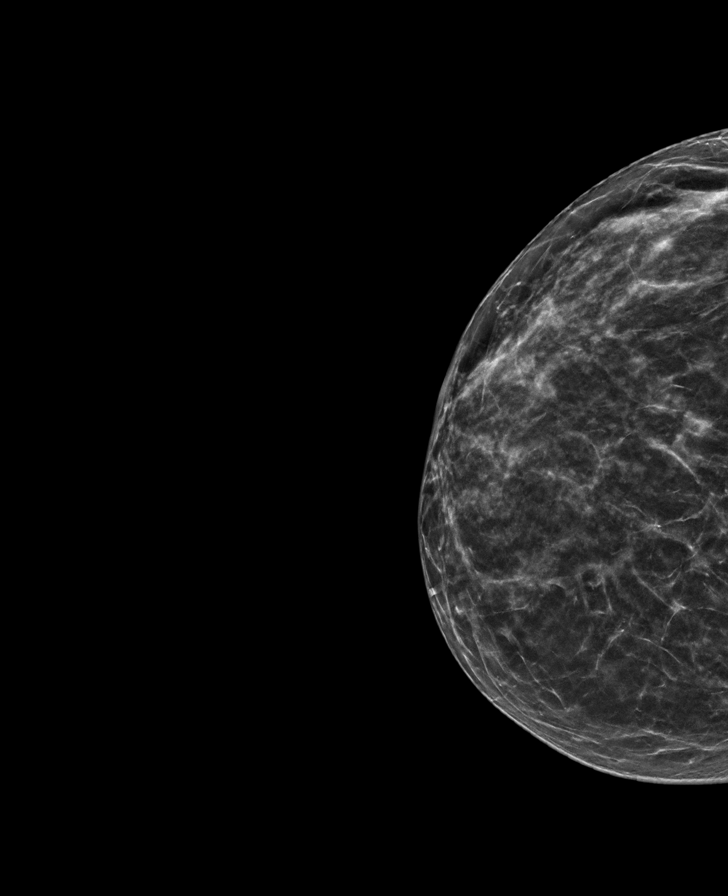

[L CC synth-2D]
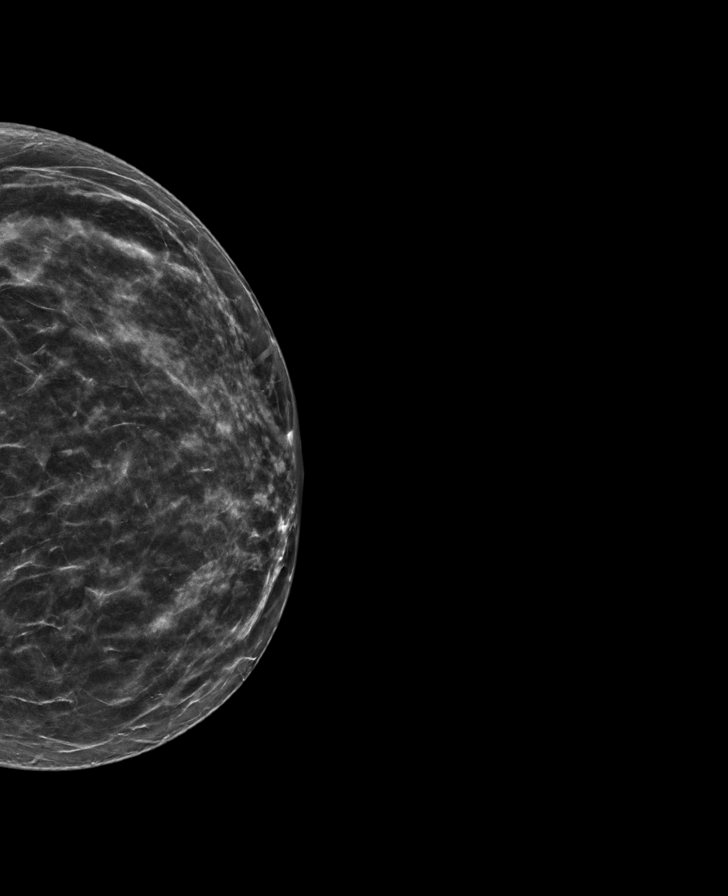

[8 of 28 positions shown; findings below may reference images not displayed]

ACR Breast Density Category b: There are scattered areas of
fibroglandular density.
FINDINGS: In the right breast, a possible asymmetry warrants further
evaluation. In the left breast, no findings suspicious for
malignancy.
IMPRESSION: Further evaluation is suggested for possible asymmetry in the right
breast. Bilateral retropectoral saline implants are unchanged.

RECOMMENDATION:
Diagnostic mammogram and possibly ultrasound of the right breast.
(Code:[id number])

The patient will be contacted regarding the findings, and additional
imaging will be scheduled.

BI-RADS CATEGORY  0: Incomplete. Need additional imaging evaluation
and/or prior mammograms for comparison.

## 2023-05-04 ENCOUNTER — Ambulatory Visit (INDEPENDENT_AMBULATORY_CARE_PROVIDER_SITE_OTHER): Payer: BC Managed Care – PPO | Admitting: Student

## 2023-05-04 VITALS — BP 113/69 | HR 78

## 2023-05-04 DIAGNOSIS — Z9886 Personal history of breast implant removal: Secondary | ICD-10-CM

## 2023-05-04 NOTE — Progress Notes (Signed)
Patient is a 47 year old female who underwent bilateral breast implant removal with Dr. Ladona Ridgel on 02/26/2023.  She eventually developed a right breast seroma requiring incision and drainage as well as drain placement on 04/09/2023.  Patient presents to the clinic today for follow-up.  Patient was last seen in the clinic on 04/20/2023.  At this visit, patient reported she is doing well.  She reported she was feeling much better.  On exam, right breast was soft, no overlying erythema or skin changes.  No fluid collections on exam.  Incision appear to be healing well.  No signs of infection on exam.  Plan is for her to follow back up in 2 weeks.  Today, patient reports she is doing well.  She states that she is returned to work and that overall it has been going well.  She states that her tiredness has improved.  She denies any fevers or chills.  Reports she has been applying Vaseline to the incision.  Denies any new issues or concerns.  Chaperone present on exam.  On exam, patient is sitting upright in no acute distress.  Breasts are soft and fairly symmetric.  There is no overlying erythema, no fluid collections palpated on exam.  NAC's are healthy bilaterally.  Incision to the right inframammary fold is overall intact, there is a superficial wound noted to the right lateral aspect of the incision.  No surrounding erythema.  No drainage.  No signs of infection.  Left breast incision has healed completely.  Discussed with patient that I would like her to continue to apply Vaseline and dressing over the incision daily.  Discussed with her to avoid adhesives.  Discussed with her would also like her to continue to wear compression until her next appointment.  Patient expressed understanding.  Patient to follow back up in 2 to 3 weeks with Dr. Ladona Ridgel.  Instructed patient to call back in the meantime she has any questions or concerns about anything.

## 2023-05-26 ENCOUNTER — Ambulatory Visit (INDEPENDENT_AMBULATORY_CARE_PROVIDER_SITE_OTHER): Payer: BC Managed Care – PPO | Admitting: Plastic Surgery

## 2023-05-26 ENCOUNTER — Encounter: Payer: Self-pay | Admitting: Plastic Surgery

## 2023-05-26 VITALS — BP 126/74 | HR 64 | Ht 64.0 in | Wt 144.4 lb

## 2023-05-26 DIAGNOSIS — N6489 Other specified disorders of breast: Secondary | ICD-10-CM

## 2023-05-26 DIAGNOSIS — Z9886 Personal history of breast implant removal: Secondary | ICD-10-CM

## 2023-05-26 DIAGNOSIS — N651 Disproportion of reconstructed breast: Secondary | ICD-10-CM

## 2023-05-26 NOTE — Progress Notes (Signed)
Ms. Lynn Nelson returns today for follow-up after bilateral breast implant removal complicated by subsequent seroma and infection of seroma.  She required return to the operating room for washout of the seroma replacement of the drain and antibiotic therapy.   Today she is doing well.  The breasts are soft and there is no evidence of fluid collection.  Her incisions are clean dry and intact.  She does have some breast asymmetry but as I reassured her this is within the norm for anatomical differences.  She is still disappointed by this.  She may return to full unrestricted activity.  Continue scar massage.  She may wear supportive garments of her choice.  Return in 3 months if she has any concerns or questions.

## 2023-08-23 ENCOUNTER — Ambulatory Visit: Payer: BC Managed Care – PPO | Admitting: Plastic Surgery

## 2024-05-30 ENCOUNTER — Encounter

## 2024-05-30 DIAGNOSIS — R002 Palpitations: Secondary | ICD-10-CM

## 2024-05-30 DIAGNOSIS — R5383 Other fatigue: Secondary | ICD-10-CM

## 2024-05-30 DIAGNOSIS — R3589 Other polyuria: Secondary | ICD-10-CM
